# Patient Record
Sex: Male | Born: 1985 | Race: White | Hispanic: No | Marital: Single
Health system: Southern US, Community
[De-identification: ages and names within clinical notes are randomized; demographics above are authoritative.]

## PROBLEM LIST (undated history)

## (undated) DIAGNOSIS — R7303 Prediabetes: Secondary | ICD-10-CM

## (undated) DIAGNOSIS — J45909 Unspecified asthma, uncomplicated: Secondary | ICD-10-CM

## (undated) DIAGNOSIS — I10 Essential (primary) hypertension: Secondary | ICD-10-CM

## (undated) DIAGNOSIS — M549 Dorsalgia, unspecified: Secondary | ICD-10-CM

## (undated) DIAGNOSIS — E079 Disorder of thyroid, unspecified: Secondary | ICD-10-CM

---
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
## 2007-01-07 ENCOUNTER — Emergency Department (HOSPITAL_COMMUNITY): Admission: EM | Admit: 2007-01-07 | Discharge: 2007-01-07 | Payer: Self-pay | Admitting: Emergency Medicine

## 2007-01-09 ENCOUNTER — Emergency Department (HOSPITAL_COMMUNITY): Admission: EM | Admit: 2007-01-09 | Discharge: 2007-01-09 | Payer: Self-pay | Admitting: Emergency Medicine

## 2007-10-11 ENCOUNTER — Emergency Department (HOSPITAL_COMMUNITY): Admission: EM | Admit: 2007-10-11 | Discharge: 2007-10-11 | Payer: Self-pay | Admitting: Emergency Medicine

## 2010-07-01 NOTE — Consult Note (Signed)
NAMEAHMON, TOSI               ACCOUNT NO.:  [id number]   MEDICAL RECORD NO.:  [id number]          PATIENT TYPE:  EMS   LOCATION:  [facility]                         FACILITY:  [facility]   PHYSICIAN:  Noralyn Pick. Eden Emms, MD, FACCDATE OF BIRTH:  1985/03/22   DATE OF CONSULTATION:  DATE OF DISCHARGE:  04/21/2006                                 CONSULTATION   He is new to [facility].  He will be seen by Dr. Eden Emms.   PATIENT PROFILE:  An 25 year old Caucasian male with prior history of  cocaine, tobacco, and alcohol abuse, who presented to the ED with chest  pain in the setting of positive urine drug screen for cocaine.   PROBLEM LIST:  1. Chest pain.  2. Substance abuse.      a.     Cocaine x9 months.  He says he is out of rehab for the past       two weeks and has not used cocaine in one month, although urine       drug screen positive for cocaine.      b.     ETOH:  Drinks less than once a week.      c.     Tobacco:  Smokes one pack a day for the past year.   HISTORY OF PRESENT ILLNESS:  An 25 year old Caucasian male with prior  history of tobacco, alcohol, and cocaine, abuse recently discharged from  rehab approximately two weeks ago by his report.  He denies using  cocaine for approximately one month.  Initially the patient told me that  he was at a friend's house yesterday, March 7, when he developed 7/10  left chest pain like an ice pick stabbing him with each heart beat,  associated with mild shortness of breath.  He says his symptoms started  at 7:30 yesterday morning and that after about two and a half hours or  so he presented to the [facility] ED.  He goes on to tell me that he has  received some pain medication that has helped his chest pain some,  bringing it from a 7/10 down to a 6/10 over the past 24 hours.  In  reality, however, following review of the patient's ER records, he  presented to the ED at 9 o'clock this morning.  In questioning him about  this, he says  he guesses he was wrong and that his chest pain must have  started at 7:30 this morning, not yesterday morning.  Regardless, he  continues to complain of 6/10 chest pain, although he dozes off  intermittently throughout the interview and is currently sleeping now  and I have left the room.  His first set of cardiac enzymes were  negative and his EKG shows fairly significant LVH with some nonspecific  T wave abnormalities and minimal ST depression in the anterior leads,  with ST depression in lead III.   ALLERGIES:  NO KNOWN DRUG ALLERGIES.   HOME MEDICATIONS:  None, although the patient says he took some Vicodin  a couple of days ago.  It is not clear where  he got it from or why he  took it.   FAMILY HISTORY:  His mother is alive and well.  She is aged 7.  His  father is alive and well at age 68.  He has a brother who is aged 58, a  sister who is aged 49; both are alive and well.   SOCIAL HISTORY:  He lives in [place] with his mother.  He is  currently unemployed.  He is not married and does not have any children.  He smokes one pack of cigarettes a day for the past year.  He uses  alcohol less than once a week, and was using cocaine regularly over a  nine-month period but says he has not used any in the past month and  finished rehab about two weeks ago.   REVIEW OF SYSTEMS:  Positive for chest pain which there is a pleuritic  component worse with deep breathing.  He also has some shortness of  breath earlier this morning.  Otherwise, all systems reviewed are  negative.   PHYSICAL EXAMINATION:  VITAL SIGNS:  Temperature 98.1, heart rate 60,  respirations 16, blood pressure 133/58, pulse ox 100% on room air.  GENERAL:  Pleasant white male in no acute distress, awake and oriented  x3, although he dozes off during interview and exam.  NECK:  No bruits or JVD.  LUNGS:  Respirations are unlabored, clear to auscultation.  CARDIAC:  Regular S1, S2.  No S3, S4, murmurs.  ABDOMEN:   Round, soft, nontender, nondistended. Bowel sounds present.  LOWER EXTREMITIES:  Warm and dry, pink.  No clubbing, cyanosis or edema.  Dorsalis pedis, posterior tibial pulses 2+ and equal bilaterally.   Chest x-ray shows no active cardiopulmonary disease.  EKG shows sinus  rhythm with a rightward axis, LVH, early repolarization, and ST  depression in lead III.   LAB WORK:  Hemoglobin 16, hematocrit 46.3, WBC 8.2, platelets 197.  Sodium 139, potassium 3.9, chloride 107, CO2 22, BUN 10, creatinine 0.8,  glucose 89.  CK-MB less than 1.  Troponin less than 0.05.  Urine drug  screen positive for cocaine.  ETOH less than 5.   ASSESSMENT/PLAN:  1. Chest pain occurring in the setting of cocaine abuse.  EKG with      left ventricular hypertrophy and minimal ST elevation, likely early      repolarization along with ST depression in lead III.  Patient      continues to complain of 6/10 chest pain, although he was sleeping      when I entered the room, dozed off some during the interview, and      is sleeping again without any intervention to treat his chest pain.      There is a pleuritic component to his chest pain.  Given his      significant LVH on EKG, we will obtain a 2D echocardiogram here in      the ER to evaluate/rule out hypertrophic heart disease.  First set      of cardiac markers are negative and we plan to continue the cycle      for complete rule out.  Would not use beta blocker given      concomitant cocaine usage.  2. Substance abuse.  Reportedly just discharged from rehabilitation      about two weeks ago.  Social Work should likely be involved in this      patient's care as his urine drug screen is once again  positive for      cocaine.      Nicolasa Ducking, ANP      Noralyn Pick. Eden Emms, MD, [facility]  Electronically Signed    CB/MEDQ  D:  04/21/2006  T:  04/22/2006  Job:  [id number]

## 2010-11-22 LAB — CBC
MCHC: 34.8
MCV: 95.1
Platelets: 260
RDW: 12.2

## 2010-11-22 LAB — COMPREHENSIVE METABOLIC PANEL
AST: 16
CO2: 25
Calcium: 9.1
Creatinine, Ser: 1.04
GFR calc Af Amer: >60
GFR calc non Af Amer: >60
Total Protein: 6.9

## 2010-11-22 LAB — DIFFERENTIAL
Eosinophils Relative: 1
Lymphocytes Relative: 33
Lymphs Abs: 3.2

## 2010-11-22 LAB — URINALYSIS, ROUTINE W REFLEX MICROSCOPIC
Glucose, UA: NEGATIVE
Ketones, ur: 15 — AB
Leukocytes, UA: NEGATIVE
pH: 6

## 2010-11-22 LAB — URINE MICROSCOPIC-ADD ON

## 2010-11-30 LAB — POCT CARDIAC MARKERS
CKMB, poc: <1 — ABNORMAL LOW
Myoglobin, poc: 32.6
Operator id: 234501
Troponin i, poc: <0.05

## 2010-11-30 LAB — I-STAT 8, (EC8 V) (CONVERTED LAB)
BUN: 13
Glucose, Bld: 46 — ABNORMAL LOW
Potassium: 4
Sodium: 142

## 2010-11-30 LAB — CK TOTAL AND CKMB (NOT AT ARMC): Total CK: 120

## 2010-12-01 LAB — I-STAT 8, (EC8 V) (CONVERTED LAB)
Acid-base deficit: 2
Chloride: 108
Glucose, Bld: 114 — ABNORMAL HIGH
Hemoglobin: 16
Potassium: 3.7
pH, Ven: 7.353 — ABNORMAL HIGH

## 2010-12-01 LAB — RAPID URINE DRUG SCREEN, HOSP PERFORMED
Barbiturates: NOT DETECTED
Benzodiazepines: NOT DETECTED
Cocaine: POSITIVE — AB

## 2010-12-01 LAB — ETHANOL: Alcohol, Ethyl (B): 42 — ABNORMAL HIGH

## 2010-12-01 LAB — POCT I-STAT CREATININE: Operator id: 270651

## 2017-10-02 ENCOUNTER — Encounter (HOSPITAL_COMMUNITY): Payer: Self-pay | Admitting: Radiology

## 2017-10-02 ENCOUNTER — Emergency Department (HOSPITAL_COMMUNITY): Payer: Medicaid Other

## 2017-10-02 ENCOUNTER — Emergency Department (HOSPITAL_COMMUNITY)
Admission: EM | Admit: 2017-10-02 | Discharge: 2017-10-03 | Disposition: A | Discharge status: Home / Self Care | Payer: Medicaid Other | Attending: Emergency Medicine | Admitting: Emergency Medicine

## 2017-10-02 DIAGNOSIS — Y92524 Gas station as the place of occurrence of the external cause: Secondary | ICD-10-CM | POA: Insufficient documentation

## 2017-10-02 DIAGNOSIS — Y990 Civilian activity done for income or pay: Secondary | ICD-10-CM | POA: Insufficient documentation

## 2017-10-02 DIAGNOSIS — Y939 Activity, unspecified: Secondary | ICD-10-CM | POA: Insufficient documentation

## 2017-10-02 DIAGNOSIS — M545 Low back pain: Secondary | ICD-10-CM

## 2017-10-02 DIAGNOSIS — R202 Paresthesia of skin: Secondary | ICD-10-CM

## 2017-10-02 DIAGNOSIS — S161XXA Strain of muscle, fascia and tendon at neck level, initial encounter: Secondary | ICD-10-CM

## 2017-10-02 MED ORDER — FENTANYL CITRATE (PF) 100 MCG/2ML IJ SOLN
100.0000 ug | Freq: Once | INTRAMUSCULAR | Status: AC
  Administered 2017-10-02: 100 ug via INTRAVENOUS

## 2017-10-02 MED ORDER — NAPROXEN 500 MG PO TABS: 500.0000 mg | ORAL_TABLET | Freq: Two times a day (BID) | ORAL | 0 refills | Status: DC

## 2017-10-02 MED ORDER — FENTANYL CITRATE (PF) 100 MCG/2ML IJ SOLN
100.0000 ug | Freq: Once | INTRAMUSCULAR | Status: DC
  Filled 2017-10-02: qty 2

## 2017-10-02 MED ORDER — HYDROCODONE-ACETAMINOPHEN 5-325 MG PO TABS
2.0000 | ORAL_TABLET | Freq: Once | ORAL | Status: AC
  Administered 2017-10-02: 2 via ORAL
  Filled 2017-10-02: qty 2

## 2017-10-02 MED ORDER — METHOCARBAMOL 500 MG PO TABS: 500.0000 mg | ORAL_TABLET | Freq: Two times a day (BID) | ORAL | 0 refills | Status: DC

## 2017-10-02 MED ORDER — KETOROLAC TROMETHAMINE 60 MG/2ML IM SOLN
60.0000 mg | Freq: Once | INTRAMUSCULAR | Status: AC
  Administered 2017-10-02: 60 mg via INTRAMUSCULAR
  Filled 2017-10-02: qty 2
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
## 2017-10-02 NOTE — ED Notes (Signed)
Patient transported to CT 

## 2017-10-02 NOTE — ED Notes (Signed)
PA at bedside.
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
## 2017-10-02 NOTE — ED Notes (Signed)
Patient transported to MRI 

## 2017-11-18 ENCOUNTER — Encounter (HOSPITAL_COMMUNITY): Payer: Self-pay

## 2017-11-18 ENCOUNTER — Emergency Department (HOSPITAL_COMMUNITY)
Admission: EM | Admit: 2017-11-18 | Discharge: 2017-11-18 | Disposition: A | Discharge status: Home / Self Care | Payer: Medicaid Other | Attending: Emergency Medicine | Admitting: Emergency Medicine

## 2017-11-18 ENCOUNTER — Other Ambulatory Visit: Payer: Self-pay

## 2017-11-18 ENCOUNTER — Emergency Department (HOSPITAL_COMMUNITY): Payer: Medicaid Other

## 2017-11-18 DIAGNOSIS — M25561 Pain in right knee: Secondary | ICD-10-CM | POA: Insufficient documentation

## 2017-11-18 DIAGNOSIS — Z79899 Other long term (current) drug therapy: Secondary | ICD-10-CM | POA: Diagnosis not present

## 2017-11-18 NOTE — Discharge Instructions (Signed)
1. Medications: Continue taking your home medicines.  The medicines you are taking for your back pain can also help with your knee pain.  You can also take [phone] mg of Tylenol every 6 hours as needed for pain. Do not exceed 4000 mg of Tylenol daily.   2. Treatment: rest, ice, elevate and use brace, drink plenty of fluids, gentle stretching 3. Follow Up: Please followup with orthopedics as directed or your PCP in 1 week if no improvement for discussion of your diagnoses and further evaluation after today's visit; if you do not have a primary care doctor use the resource guide provided to find one; Please return to the ER for worsening symptoms or other concerns such as worsening swelling, redness of the skin, fevers, loss of pulses, or loss of feeling
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
## 2018-03-31 ENCOUNTER — Emergency Department (HOSPITAL_COMMUNITY): Payer: Medicaid Other

## 2018-03-31 ENCOUNTER — Other Ambulatory Visit: Payer: Self-pay

## 2018-03-31 ENCOUNTER — Emergency Department (HOSPITAL_COMMUNITY)
Admission: EM | Admit: 2018-03-31 | Discharge: 2018-03-31 | Disposition: A | Discharge status: Home / Self Care | Payer: Medicaid Other | Attending: Emergency Medicine | Admitting: Emergency Medicine

## 2018-03-31 ENCOUNTER — Encounter (HOSPITAL_COMMUNITY): Payer: Self-pay | Admitting: Emergency Medicine

## 2018-03-31 DIAGNOSIS — S8392XA Sprain of unspecified site of left knee, initial encounter: Secondary | ICD-10-CM | POA: Diagnosis not present

## 2018-03-31 DIAGNOSIS — Y999 Unspecified external cause status: Secondary | ICD-10-CM | POA: Diagnosis not present

## 2018-03-31 DIAGNOSIS — Z79899 Other long term (current) drug therapy: Secondary | ICD-10-CM | POA: Insufficient documentation

## 2018-03-31 DIAGNOSIS — Y939 Activity, unspecified: Secondary | ICD-10-CM | POA: Diagnosis not present

## 2018-03-31 DIAGNOSIS — X58XXXA Exposure to other specified factors, initial encounter: Secondary | ICD-10-CM | POA: Diagnosis not present

## 2018-03-31 DIAGNOSIS — S8992XA Unspecified injury of left lower leg, initial encounter: Secondary | ICD-10-CM | POA: Diagnosis present

## 2018-03-31 DIAGNOSIS — Y929 Unspecified place or not applicable: Secondary | ICD-10-CM | POA: Diagnosis not present

## 2018-03-31 MED ORDER — IBUPROFEN 800 MG PO TABS: 800.0000 mg | ORAL_TABLET | Freq: Three times a day (TID) | ORAL | 0 refills | Status: DC
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
## 2018-03-31 NOTE — Discharge Instructions (Signed)
Return if any problems.

## 2018-10-25 ENCOUNTER — Inpatient Hospital Stay (HOSPITAL_COMMUNITY)
Admission: EM | Admit: 2018-10-25 | Discharge: 2018-10-25 | DRG: 917 | Disposition: A | Discharge status: Home / Self Care | Payer: Medicaid Other | Attending: Pulmonary Disease | Admitting: Pulmonary Disease

## 2018-10-25 ENCOUNTER — Inpatient Hospital Stay (HOSPITAL_COMMUNITY): Payer: Medicaid Other

## 2018-10-25 ENCOUNTER — Encounter (HOSPITAL_COMMUNITY): Payer: Self-pay | Admitting: Emergency Medicine

## 2018-10-25 ENCOUNTER — Emergency Department (HOSPITAL_COMMUNITY): Payer: Medicaid Other

## 2018-10-25 ENCOUNTER — Other Ambulatory Visit: Payer: Self-pay

## 2018-10-25 DIAGNOSIS — R402112 Coma scale, eyes open, never, at arrival to emergency department: Secondary | ICD-10-CM | POA: Diagnosis present

## 2018-10-25 DIAGNOSIS — G92 Toxic encephalopathy: Secondary | ICD-10-CM | POA: Diagnosis present

## 2018-10-25 DIAGNOSIS — I10 Essential (primary) hypertension: Secondary | ICD-10-CM | POA: Diagnosis present

## 2018-10-25 DIAGNOSIS — Y907 Blood alcohol level of 200-239 mg/100 ml: Secondary | ICD-10-CM | POA: Diagnosis present

## 2018-10-25 DIAGNOSIS — G8929 Other chronic pain: Secondary | ICD-10-CM | POA: Diagnosis present

## 2018-10-25 DIAGNOSIS — T404X1A Poisoning by other synthetic narcotics, accidental (unintentional), initial encounter: Secondary | ICD-10-CM | POA: Diagnosis present

## 2018-10-25 DIAGNOSIS — U071 COVID-19: Secondary | ICD-10-CM | POA: Diagnosis present

## 2018-10-25 DIAGNOSIS — R402432 Glasgow coma scale score 3-8, at arrival to emergency department: Secondary | ICD-10-CM | POA: Diagnosis not present

## 2018-10-25 DIAGNOSIS — H9190 Unspecified hearing loss, unspecified ear: Secondary | ICD-10-CM | POA: Diagnosis present

## 2018-10-25 DIAGNOSIS — T428X1A Poisoning by antiparkinsonism drugs and other central muscle-tone depressants, accidental (unintentional), initial encounter: Secondary | ICD-10-CM | POA: Diagnosis present

## 2018-10-25 DIAGNOSIS — J9690 Respiratory failure, unspecified, unspecified whether with hypoxia or hypercapnia: Secondary | ICD-10-CM | POA: Diagnosis present

## 2018-10-25 DIAGNOSIS — F1010 Alcohol abuse, uncomplicated: Secondary | ICD-10-CM

## 2018-10-25 DIAGNOSIS — G9340 Encephalopathy, unspecified: Secondary | ICD-10-CM | POA: Diagnosis not present

## 2018-10-25 DIAGNOSIS — R402312 Coma scale, best motor response, none, at arrival to emergency department: Secondary | ICD-10-CM | POA: Diagnosis present

## 2018-10-25 DIAGNOSIS — R55 Syncope and collapse: Secondary | ICD-10-CM | POA: Diagnosis not present

## 2018-10-25 DIAGNOSIS — M549 Dorsalgia, unspecified: Secondary | ICD-10-CM | POA: Diagnosis present

## 2018-10-25 DIAGNOSIS — I252 Old myocardial infarction: Secondary | ICD-10-CM

## 2018-10-25 DIAGNOSIS — I2510 Atherosclerotic heart disease of native coronary artery without angina pectoris: Secondary | ICD-10-CM | POA: Diagnosis present

## 2018-10-25 DIAGNOSIS — Z978 Presence of other specified devices: Secondary | ICD-10-CM | POA: Insufficient documentation

## 2018-10-25 DIAGNOSIS — R40243 Glasgow coma scale score 3-8, unspecified time: Secondary | ICD-10-CM | POA: Insufficient documentation

## 2018-10-25 DIAGNOSIS — J9601 Acute respiratory failure with hypoxia: Secondary | ICD-10-CM

## 2018-10-25 DIAGNOSIS — R402212 Coma scale, best verbal response, none, at arrival to emergency department: Secondary | ICD-10-CM | POA: Diagnosis present

## 2018-10-25 DIAGNOSIS — R4182 Altered mental status, unspecified: Secondary | ICD-10-CM | POA: Diagnosis present

## 2018-10-25 LAB — CBC WITH DIFFERENTIAL/PLATELET
Abs Immature Granulocytes: 0.05 10*3/uL (ref 0.00–0.07)
Basophils Absolute: 0.1 10*3/uL (ref 0.0–0.1)
Basophils Relative: 1 %
Eosinophils Absolute: 0.1 10*3/uL (ref 0.0–0.5)
Eosinophils Relative: 2 %
HCT: 46.1 % (ref 39.0–52.0)
Hemoglobin: 16.1 g/dL (ref 13.0–17.0)
Immature Granulocytes: 1 %
Lymphocytes Relative: 53 %
Lymphs Abs: 5.1 10*3/uL — ABNORMAL HIGH (ref 0.7–4.0)
MCH: 33.8 pg (ref 26.0–34.0)
MCHC: 34.9 g/dL (ref 30.0–36.0)
MCV: 96.6 fL (ref 80.0–100.0)
Monocytes Absolute: 0.8 10*3/uL (ref 0.1–1.0)
Monocytes Relative: 9 %
Neutro Abs: 3.2 10*3/uL (ref 1.7–7.7)
Neutrophils Relative %: 34 %
Platelets: 172 10*3/uL (ref 150–400)
RBC: 4.77 MIL/uL (ref 4.22–5.81)
RDW: 11.7 % (ref 11.5–15.5)
WBC: 9.3 10*3/uL (ref 4.0–10.5)
nRBC: 0 % (ref 0.0–0.2)

## 2018-10-25 LAB — CREATININE, SERUM
Creatinine, Ser: 0.85 mg/dL (ref 0.61–1.24)
GFR calc Af Amer: >60 mL/min (ref >60)
GFR calc non Af Amer: >60 mL/min (ref >60)

## 2018-10-25 LAB — TRIGLYCERIDES: Triglycerides: 338 mg/dL — ABNORMAL HIGH (ref <150)

## 2018-10-25 LAB — URINALYSIS, COMPLETE (UACMP) WITH MICROSCOPIC
Bacteria, UA: NONE SEEN
Bilirubin Urine: NEGATIVE
Glucose, UA: NEGATIVE mg/dL
Hgb urine dipstick: NEGATIVE
Ketones, ur: NEGATIVE mg/dL
Leukocytes,Ua: NEGATIVE
Nitrite: NEGATIVE
Protein, ur: NEGATIVE mg/dL
Specific Gravity, Urine: 1.005 (ref 1.005–1.030)
pH: 6 (ref 5.0–8.0)

## 2018-10-25 LAB — LACTATE DEHYDROGENASE: LDH: 185 U/L (ref 98–192)

## 2018-10-25 LAB — COMPREHENSIVE METABOLIC PANEL
ALT: 83 U/L — ABNORMAL HIGH (ref 0–44)
AST: 44 U/L — ABNORMAL HIGH (ref 15–41)
Albumin: 4.3 g/dL (ref 3.5–5.0)
Alkaline Phosphatase: 72 U/L (ref 38–126)
Anion gap: 12 (ref 5–15)
BUN: 16 mg/dL (ref 6–20)
CO2: 22 mmol/L (ref 22–32)
Calcium: 9 mg/dL (ref 8.9–10.3)
Chloride: 106 mmol/L (ref 98–111)
Creatinine, Ser: 1.05 mg/dL (ref 0.61–1.24)
GFR calc Af Amer: >60 mL/min (ref >60)
GFR calc non Af Amer: >60 mL/min (ref >60)
Glucose, Bld: 119 mg/dL — ABNORMAL HIGH (ref 70–99)
Potassium: 3.5 mmol/L (ref 3.5–5.1)
Sodium: 140 mmol/L (ref 135–145)
Total Bilirubin: 0.5 mg/dL (ref 0.3–1.2)
Total Protein: 7.1 g/dL (ref 6.5–8.1)

## 2018-10-25 LAB — TROPONIN I (HIGH SENSITIVITY)
Troponin I (High Sensitivity): 5 ng/L (ref <18)
Troponin I (High Sensitivity): 7 ng/L (ref <18)

## 2018-10-25 LAB — CBC
HCT: 41.7 % (ref 39.0–52.0)
Hemoglobin: 14.7 g/dL (ref 13.0–17.0)
MCH: 33.5 pg (ref 26.0–34.0)
MCHC: 35.3 g/dL (ref 30.0–36.0)
MCV: 95 fL (ref 80.0–100.0)
Platelets: 159 10*3/uL (ref 150–400)
RBC: 4.39 MIL/uL (ref 4.22–5.81)
RDW: 11.7 % (ref 11.5–15.5)
WBC: 8.2 10*3/uL (ref 4.0–10.5)
nRBC: 0 % (ref 0.0–0.2)

## 2018-10-25 LAB — POCT I-STAT 7, (LYTES, BLD GAS, ICA,H+H)
Acid-base deficit: 1 mmol/L (ref 0.0–2.0)
Bicarbonate: 24.9 mmol/L (ref 20.0–28.0)
Calcium, Ion: 1.19 mmol/L (ref 1.15–1.40)
HCT: 44 % (ref 39.0–52.0)
Hemoglobin: 15 g/dL (ref 13.0–17.0)
O2 Saturation: 100 %
Patient temperature: 98.4
Potassium: 3.7 mmol/L (ref 3.5–5.1)
Sodium: 141 mmol/L (ref 135–145)
TCO2: 26 mmol/L (ref 22–32)
pCO2 arterial: 46.3 mmHg (ref 32.0–48.0)
pH, Arterial: 7.338 — ABNORMAL LOW (ref 7.350–7.450)
pO2, Arterial: 571 mmHg — ABNORMAL HIGH (ref 83.0–108.0)

## 2018-10-25 LAB — ETHANOL: Alcohol, Ethyl (B): 224 mg/dL — ABNORMAL HIGH (ref <10)

## 2018-10-25 LAB — GLUCOSE, CAPILLARY
Glucose-Capillary: 105 mg/dL — ABNORMAL HIGH (ref 70–99)
Glucose-Capillary: 125 mg/dL — ABNORMAL HIGH (ref 70–99)

## 2018-10-25 LAB — RAPID URINE DRUG SCREEN, HOSP PERFORMED
Amphetamines: NOT DETECTED
Barbiturates: NOT DETECTED
Benzodiazepines: POSITIVE — AB
Cocaine: NOT DETECTED
Opiates: NOT DETECTED
Tetrahydrocannabinol: NOT DETECTED

## 2018-10-25 LAB — ECHOCARDIOGRAM COMPLETE
Height: 70 in
Weight: 3781.33 oz

## 2018-10-25 LAB — SARS CORONAVIRUS 2 BY RT PCR (HOSPITAL ORDER, PERFORMED IN ~~LOC~~ HOSPITAL LAB): SARS Coronavirus 2: POSITIVE — AB

## 2018-10-25 LAB — PROTIME-INR
INR: 1 (ref 0.8–1.2)
Prothrombin Time: 12.9 seconds (ref 11.4–15.2)

## 2018-10-25 LAB — LACTIC ACID, PLASMA
Lactic Acid, Venous: 2.5 mmol/L (ref 0.5–1.9)
Lactic Acid, Venous: 2.5 mmol/L (ref 0.5–1.9)

## 2018-10-25 LAB — SARS CORONAVIRUS 2 (TAT 6-24 HRS): SARS Coronavirus 2: NEGATIVE

## 2018-10-25 LAB — FERRITIN: Ferritin: 450 ng/mL — ABNORMAL HIGH (ref 24–336)

## 2018-10-25 LAB — APTT: aPTT: 31 seconds (ref 24–36)

## 2018-10-25 LAB — HIV ANTIBODY (ROUTINE TESTING W REFLEX): HIV Screen 4th Generation wRfx: NONREACTIVE

## 2018-10-25 LAB — MRSA PCR SCREENING: MRSA by PCR: NEGATIVE

## 2018-10-25 LAB — D-DIMER, QUANTITATIVE: D-Dimer, Quant: <0.27 ug/mL-FEU (ref 0.00–0.50)

## 2018-10-25 MED ORDER — VITAMIN B-1 100 MG PO TABS: 100.0000 mg | ORAL_TABLET | Freq: Every day | ORAL | Status: DC

## 2018-10-25 MED ORDER — MIDAZOLAM HCL 2 MG/2ML IJ SOLN
2.0000 mg | Freq: Once | INTRAMUSCULAR | Status: AC
  Administered 2018-10-25: 2 mg via INTRAVENOUS

## 2018-10-25 MED ORDER — PROPOFOL 1000 MG/100ML IV EMUL
INTRAVENOUS | Status: AC
  Administered 2018-10-25: 01:00:00 via INTRAVENOUS
  Filled 2018-10-25: qty 100

## 2018-10-25 MED ORDER — ETOMIDATE 2 MG/ML IV SOLN
INTRAVENOUS | Status: AC | PRN
  Administered 2018-10-25: 20 mg via INTRAVENOUS

## 2018-10-25 MED ORDER — AMLODIPINE BESYLATE 5 MG PO TABS
5.0000 mg | ORAL_TABLET | Freq: Every day | ORAL | Status: DC
  Administered 2018-10-25: 5 mg via ORAL
  Filled 2018-10-25 (×2): qty 1

## 2018-10-25 MED ORDER — SODIUM CHLORIDE 0.9 % IV SOLN
INTRAVENOUS | Status: DC | PRN
  Administered 2018-10-25: 1000 mL via INTRAVENOUS

## 2018-10-25 MED ORDER — FAMOTIDINE IN NACL 20-0.9 MG/50ML-% IV SOLN
20.0000 mg | Freq: Two times a day (BID) | INTRAVENOUS | Status: DC
  Administered 2018-10-25: 11:00:00 20 mg via INTRAVENOUS
  Filled 2018-10-25: qty 50

## 2018-10-25 MED ORDER — GABAPENTIN 250 MG/5ML PO SOLN
100.0000 mg | Freq: Three times a day (TID) | ORAL | Status: DC
  Administered 2018-10-25: 100 mg
  Filled 2018-10-25 (×3): qty 2

## 2018-10-25 MED ORDER — AMLODIPINE BESYLATE 5 MG PO TABS: 5.0000 mg | ORAL_TABLET | Freq: Every day | ORAL | 3 refills | Status: DC

## 2018-10-25 MED ORDER — MIDAZOLAM HCL 2 MG/2ML IJ SOLN
INTRAMUSCULAR | Status: AC
  Filled 2018-10-25: qty 2

## 2018-10-25 MED ORDER — FENTANYL CITRATE (PF) 100 MCG/2ML IJ SOLN
100.0000 ug | INTRAMUSCULAR | Status: DC | PRN
  Administered 2018-10-25: 100 ug via INTRAVENOUS
  Filled 2018-10-25: qty 2

## 2018-10-25 MED ORDER — FOLIC ACID 5 MG/ML IJ SOLN
1.0000 mg | Freq: Once | INTRAMUSCULAR | Status: AC
  Administered 2018-10-25: 05:00:00 1 mg via INTRAVENOUS
  Filled 2018-10-25: qty 0.2

## 2018-10-25 MED ORDER — NALOXONE HCL 2 MG/2ML IJ SOSY
PREFILLED_SYRINGE | INTRAMUSCULAR | Status: AC
  Filled 2018-10-25: qty 2

## 2018-10-25 MED ORDER — PROPOFOL 1000 MG/100ML IV EMUL
0.0000 ug/kg/min | INTRAVENOUS | Status: DC
  Administered 2018-10-25: 01:00:00 via INTRAVENOUS
  Administered 2018-10-25: 07:00:00 20 ug/kg/min via INTRAVENOUS
  Filled 2018-10-25: qty 100

## 2018-10-25 MED ORDER — FENTANYL CITRATE (PF) 100 MCG/2ML IJ SOLN
INTRAMUSCULAR | Status: AC
  Filled 2018-10-25: qty 2

## 2018-10-25 MED ORDER — MIDAZOLAM HCL 2 MG/2ML IJ SOLN: 2.0000 mg | INTRAMUSCULAR | Status: DC | PRN

## 2018-10-25 MED ORDER — CHLORHEXIDINE GLUCONATE CLOTH 2 % EX PADS
6.0000 | MEDICATED_PAD | Freq: Every day | CUTANEOUS | Status: DC
  Administered 2018-10-25: 6 via TOPICAL

## 2018-10-25 MED ORDER — FENTANYL CITRATE (PF) 100 MCG/2ML IJ SOLN
100.0000 ug | Freq: Once | INTRAMUSCULAR | Status: AC
  Administered 2018-10-25: 01:00:00 100 ug via INTRAVENOUS
  Filled 2018-10-25: qty 2

## 2018-10-25 MED ORDER — PERFLUTREN LIPID MICROSPHERE
1.0000 mL | INTRAVENOUS | Status: AC | PRN
  Administered 2018-10-25: 11:00:00 3.5 mL via INTRAVENOUS
  Filled 2018-10-25: qty 10

## 2018-10-25 MED ORDER — FENTANYL CITRATE (PF) 100 MCG/2ML IJ SOLN
100.0000 ug | Freq: Once | INTRAMUSCULAR | Status: AC
  Administered 2018-10-25: 02:00:00 100 ug via INTRAVENOUS

## 2018-10-25 MED ORDER — THIAMINE HCL 100 MG/ML IJ SOLN
100.0000 mg | Freq: Every day | INTRAMUSCULAR | Status: DC
  Administered 2018-10-25: 11:00:00 100 mg via INTRAVENOUS
  Filled 2018-10-25 (×2): qty 2

## 2018-10-25 MED ORDER — NALOXONE HCL 2 MG/2ML IJ SOSY
PREFILLED_SYRINGE | INTRAMUSCULAR | Status: AC | PRN
  Administered 2018-10-25: 1 mg via INTRAVENOUS

## 2018-10-25 MED ORDER — ZINC SULFATE 220 (50 ZN) MG PO CAPS
220.0000 mg | ORAL_CAPSULE | Freq: Every day | ORAL | Status: DC
  Administered 2018-10-25: 05:00:00 220 mg via ORAL
  Filled 2018-10-25 (×2): qty 1

## 2018-10-25 MED ORDER — FENTANYL CITRATE (PF) 100 MCG/2ML IJ SOLN: 100.0000 ug | INTRAMUSCULAR | Status: DC | PRN

## 2018-10-25 MED ORDER — MIDAZOLAM HCL 2 MG/2ML IJ SOLN
2.0000 mg | INTRAMUSCULAR | Status: DC | PRN
  Filled 2018-10-25: qty 2

## 2018-10-25 MED ORDER — SODIUM CHLORIDE 0.9 % IV SOLN: 1.0000 mg | Freq: Once | INTRAVENOUS | Status: DC

## 2018-10-25 MED ORDER — ORAL CARE MOUTH RINSE
15.0000 mL | OROMUCOSAL | Status: DC
  Administered 2018-10-25: 06:00:00 15 mL via OROMUCOSAL

## 2018-10-25 MED ORDER — CHLORHEXIDINE GLUCONATE 0.12% ORAL RINSE (MEDLINE KIT)
15.0000 mL | Freq: Two times a day (BID) | OROMUCOSAL | Status: DC
  Administered 2018-10-25: 08:00:00 15 mL via OROMUCOSAL

## 2018-10-25 MED ORDER — SODIUM CHLORIDE 0.9 % IV BOLUS (SEPSIS)
1000.0000 mL | Freq: Once | INTRAVENOUS | Status: AC
  Administered 2018-10-25: 03:00:00 1000 mL via INTRAVENOUS

## 2018-10-25 MED ORDER — PROPOFOL 1000 MG/100ML IV EMUL
INTRAVENOUS | Status: AC
  Filled 2018-10-25: qty 100

## 2018-10-25 MED ORDER — MIDAZOLAM HCL 2 MG/2ML IJ SOLN
2.0000 mg | Freq: Once | INTRAMUSCULAR | Status: AC
  Administered 2018-10-25: 2 mg via INTRAVENOUS
  Filled 2018-10-25: qty 2

## 2018-10-25 MED ORDER — HEPARIN SODIUM (PORCINE) 5000 UNIT/ML IJ SOLN
5000.0000 [IU] | Freq: Three times a day (TID) | INTRAMUSCULAR | Status: DC
  Administered 2018-10-25: 5000 [IU] via SUBCUTANEOUS
  Filled 2018-10-25: qty 1

## 2018-10-25 MED ORDER — SUCCINYLCHOLINE CHLORIDE 20 MG/ML IJ SOLN
INTRAMUSCULAR | Status: AC | PRN
  Administered 2018-10-25: 200 mg via INTRAVENOUS

## 2018-10-25 NOTE — Progress Notes (Signed)
Patient transported to CT and back without any complications.  

## 2018-10-25 NOTE — Progress Notes (Signed)
Discharged home with girlfriend - all questions answered. Discharge paperwork discussed and taken with patient.

## 2018-10-25 NOTE — Progress Notes (Signed)
Echocardiogram 2D Echocardiogram has been performed.  Oneal Deputy Breslin Hemann 10/25/2018, 11:39 AM
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
## 2018-10-25 NOTE — ED Notes (Signed)
Date and time results received: 10/25/18  (use smartphrase ".now" to insert current time)  Test: lac ti Critical Value: 2.5  Name of Provider Notified: Christy Gentles
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
## 2018-10-25 NOTE — Progress Notes (Signed)
Pt transported from ED TRAA to 0P23 with no complications.
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
## 2018-10-25 NOTE — Progress Notes (Signed)
Per patient request, mother and significant other called and updated on patient status and plan of care.

## 2018-10-25 NOTE — ED Triage Notes (Signed)
Pt brought to ED by [facility] from home after getting unresponsive at home. Per wife pt last took some Tramadol for pain with ETOH. On the way to ED pt started having some seizure activity and 5 mg Versed IV pta to ED. BOP 145/102, R 24, HR 120, SPO2 100 RA. CBG 84.

## 2018-10-25 NOTE — Procedures (Signed)
Extubation Procedure Note  Patient Details:   Name: Larry Rojas DOB: [birth year] MRN: [id number]   Airway Documentation:    Vent end date: 10/25/18 Vent end time: 0936   Evaluation  O2 sats: stable throughout Complications: No apparent complications Patient did tolerate procedure well. Bilateral Breath Sounds: Clear, Diminished   Yes   Pt extubated to 2L N/C.  No stridor noted.  RN @ bedside.  Donnetta Hail 10/25/2018, 9:47 AM

## 2018-10-28 ENCOUNTER — Encounter (HOSPITAL_COMMUNITY): Payer: Self-pay | Admitting: Emergency Medicine

## 2018-10-31 ENCOUNTER — Emergency Department (HOSPITAL_COMMUNITY): Payer: Medicaid Other

## 2018-10-31 ENCOUNTER — Encounter (HOSPITAL_COMMUNITY): Payer: Self-pay | Admitting: *Deleted

## 2018-10-31 ENCOUNTER — Emergency Department (HOSPITAL_COMMUNITY)
Admission: EM | Admit: 2018-10-31 | Discharge: 2018-10-31 | Disposition: A | Discharge status: Home / Self Care | Payer: Medicaid Other | Attending: Emergency Medicine | Admitting: Emergency Medicine

## 2018-10-31 ENCOUNTER — Other Ambulatory Visit: Payer: Self-pay

## 2018-10-31 DIAGNOSIS — Z79899 Other long term (current) drug therapy: Secondary | ICD-10-CM | POA: Diagnosis not present

## 2018-10-31 DIAGNOSIS — R079 Chest pain, unspecified: Secondary | ICD-10-CM | POA: Insufficient documentation

## 2018-10-31 LAB — CBC WITH DIFFERENTIAL/PLATELET
Abs Immature Granulocytes: 0.05 10*3/uL (ref 0.00–0.07)
Basophils Absolute: 0.1 10*3/uL (ref 0.0–0.1)
Basophils Relative: 1 %
Eosinophils Absolute: 0.2 10*3/uL (ref 0.0–0.5)
Eosinophils Relative: 2 %
HCT: 46 % (ref 39.0–52.0)
Hemoglobin: 16 g/dL (ref 13.0–17.0)
Immature Granulocytes: 1 %
Lymphocytes Relative: 46 %
Lymphs Abs: 4.4 10*3/uL — ABNORMAL HIGH (ref 0.7–4.0)
MCH: 33.6 pg (ref 26.0–34.0)
MCHC: 34.8 g/dL (ref 30.0–36.0)
MCV: 96.6 fL (ref 80.0–100.0)
Monocytes Absolute: 0.8 10*3/uL (ref 0.1–1.0)
Monocytes Relative: 8 %
Neutro Abs: 3.9 10*3/uL (ref 1.7–7.7)
Neutrophils Relative %: 42 %
Platelets: 192 10*3/uL (ref 150–400)
RBC: 4.76 MIL/uL (ref 4.22–5.81)
RDW: 11.4 % — ABNORMAL LOW (ref 11.5–15.5)
WBC: 9.4 10*3/uL (ref 4.0–10.5)
nRBC: 0 % (ref 0.0–0.2)

## 2018-10-31 LAB — BASIC METABOLIC PANEL
Anion gap: 16 — ABNORMAL HIGH (ref 5–15)
BUN: 18 mg/dL (ref 6–20)
CO2: 18 mmol/L — ABNORMAL LOW (ref 22–32)
Calcium: 8.8 mg/dL — ABNORMAL LOW (ref 8.9–10.3)
Chloride: 104 mmol/L (ref 98–111)
Creatinine, Ser: 0.83 mg/dL (ref 0.61–1.24)
GFR calc Af Amer: >60 mL/min (ref >60)
GFR calc non Af Amer: >60 mL/min (ref >60)
Glucose, Bld: 113 mg/dL — ABNORMAL HIGH (ref 70–99)
Potassium: 3.4 mmol/L — ABNORMAL LOW (ref 3.5–5.1)
Sodium: 138 mmol/L (ref 135–145)

## 2018-10-31 LAB — ETHANOL: Alcohol, Ethyl (B): 115 mg/dL — ABNORMAL HIGH (ref <10)

## 2018-10-31 LAB — TROPONIN I (HIGH SENSITIVITY): Troponin I (High Sensitivity): 4 ng/L (ref <18)

## 2018-10-31 NOTE — ED Notes (Signed)
Pt still sleeping...

## 2018-10-31 NOTE — Discharge Instructions (Signed)
Cardiac tests today were normal. Please follow-up with your primary care doctor. Return here for any new/acute changes.
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
## 2018-10-31 NOTE — ED Notes (Signed)
Sleeping still 
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
## 2018-10-31 NOTE — ED Notes (Signed)
Pt alert oriented skin warm and dry  No distress.   Iv per ems  Aspirin 324,g nitro sl  No effect except on his bp  Ems gave  114ml nss

## 2018-10-31 NOTE — ED Notes (Signed)
Pt sleeping soundly snoring  Stayed asleep even when I stuck him for lab work

## 2018-11-04 LAB — DRUG SCREEN 10 W/CONF, SERUM
Amphetamines, IA: NEGATIVE ng/mL
Barbiturates, IA: NEGATIVE ug/mL
Benzodiazepines, IA: POSITIVE ng/mL — AB
Cocaine & Metabolite, IA: NEGATIVE ng/mL
Methadone, IA: NEGATIVE ng/mL
Opiates, IA: NEGATIVE ng/mL
Oxycodones, IA: NEGATIVE ng/mL
Phencyclidine, IA: NEGATIVE ng/mL
Propoxyphene, IA: NEGATIVE ng/mL
THC(Marijuana) Metabolite, IA: NEGATIVE ng/mL

## 2019-05-20 ENCOUNTER — Ambulatory Visit (HOSPITAL_COMMUNITY): Payer: Medicaid Other | Admitting: Licensed Clinical Social Worker

## 2020-01-02 ENCOUNTER — Emergency Department (HOSPITAL_COMMUNITY)
Admission: EM | Admit: 2020-01-02 | Discharge: 2020-01-02 | Disposition: A | Discharge status: Home / Self Care | Payer: Medicaid Other | Attending: Emergency Medicine | Admitting: Emergency Medicine

## 2020-01-02 ENCOUNTER — Emergency Department (HOSPITAL_COMMUNITY): Payer: Medicaid Other

## 2020-01-02 ENCOUNTER — Other Ambulatory Visit: Payer: Self-pay

## 2020-01-02 ENCOUNTER — Encounter (HOSPITAL_COMMUNITY): Payer: Self-pay

## 2020-01-02 DIAGNOSIS — R55 Syncope and collapse: Secondary | ICD-10-CM | POA: Diagnosis not present

## 2020-01-02 DIAGNOSIS — U071 COVID-19: Secondary | ICD-10-CM

## 2020-01-02 DIAGNOSIS — F10920 Alcohol use, unspecified with intoxication, uncomplicated: Secondary | ICD-10-CM

## 2020-01-02 DIAGNOSIS — R0602 Shortness of breath: Secondary | ICD-10-CM | POA: Diagnosis not present

## 2020-01-02 DIAGNOSIS — R059 Cough, unspecified: Secondary | ICD-10-CM | POA: Diagnosis present

## 2020-01-02 DIAGNOSIS — Y906 Blood alcohol level of 120-199 mg/100 ml: Secondary | ICD-10-CM | POA: Diagnosis not present

## 2020-01-02 DIAGNOSIS — F10129 Alcohol abuse with intoxication, unspecified: Secondary | ICD-10-CM | POA: Insufficient documentation

## 2020-01-02 LAB — CBC WITH DIFFERENTIAL/PLATELET
Abs Immature Granulocytes: 0.15 10*3/uL — ABNORMAL HIGH (ref 0.00–0.07)
Basophils Absolute: 0.1 10*3/uL (ref 0.0–0.1)
Basophils Relative: 1 %
Eosinophils Absolute: 0.2 10*3/uL (ref 0.0–0.5)
Eosinophils Relative: 2 %
HCT: 45 % (ref 39.0–52.0)
Hemoglobin: 15.3 g/dL (ref 13.0–17.0)
Immature Granulocytes: 1 %
Lymphocytes Relative: 40 %
Lymphs Abs: 4.3 10*3/uL — ABNORMAL HIGH (ref 0.7–4.0)
MCH: 33.6 pg (ref 26.0–34.0)
MCHC: 34 g/dL (ref 30.0–36.0)
MCV: 98.7 fL (ref 80.0–100.0)
Monocytes Absolute: 1.1 10*3/uL — ABNORMAL HIGH (ref 0.1–1.0)
Monocytes Relative: 10 %
Neutro Abs: 5 10*3/uL (ref 1.7–7.7)
Neutrophils Relative %: 46 %
Platelets: 179 10*3/uL (ref 150–400)
RBC: 4.56 MIL/uL (ref 4.22–5.81)
RDW: 11.8 % (ref 11.5–15.5)
WBC: 10.9 10*3/uL — ABNORMAL HIGH (ref 4.0–10.5)
nRBC: 0 % (ref 0.0–0.2)

## 2020-01-02 LAB — BASIC METABOLIC PANEL
Anion gap: 10 (ref 5–15)
BUN: 14 mg/dL (ref 6–20)
CO2: 24 mmol/L (ref 22–32)
Calcium: 9 mg/dL (ref 8.9–10.3)
Chloride: 107 mmol/L (ref 98–111)
Creatinine, Ser: 0.79 mg/dL (ref 0.61–1.24)
GFR, Estimated: >60 mL/min (ref >60)
Glucose, Bld: 119 mg/dL — ABNORMAL HIGH (ref 70–99)
Potassium: 3.9 mmol/L (ref 3.5–5.1)
Sodium: 141 mmol/L (ref 135–145)

## 2020-01-02 LAB — BRAIN NATRIURETIC PEPTIDE: B Natriuretic Peptide: 23.2 pg/mL (ref 0.0–100.0)

## 2020-01-02 LAB — ETHANOL: Alcohol, Ethyl (B): 187 mg/dL — ABNORMAL HIGH (ref <10)

## 2020-01-02 LAB — TROPONIN I (HIGH SENSITIVITY): Troponin I (High Sensitivity): 8 ng/L (ref <18)

## 2020-01-02 LAB — D-DIMER, QUANTITATIVE: D-Dimer, Quant: 0.41 ug/mL-FEU (ref 0.00–0.50)

## 2020-01-02 NOTE — ED Provider Notes (Signed)
[name] EMERGENCY DEPARTMENT Provider Note   CSN: [id number] Arrival date & time: 01/02/20  0531     History Chief Complaint  Patient presents with  . Covid Positive  . Loss of Consciousness    Larry Rojas is a 34 y.o. male.  Patient presents to the emergency department for evaluation of syncopal episodes.  Patient has been ill for some time.  He was diagnosed with Covid 10 days ago.  He has had cough, congestion, generalized weakness.  He has had progressively worsening shortness of breath.        History reviewed. No pertinent past medical history.  Patient Active Problem List   Diagnosis Date Noted  . Encephalopathy acute 10/25/2018  . Glasgow coma scale total score 3-8 (HCC)   . Endotracheally intubated   . Hearing loss   . Acute encephalopathy   . Alcohol abuse   . Acute respiratory failure with hypoxia (HCC)     History reviewed. No pertinent surgical history.     History reviewed. No pertinent family history.  Social History   Tobacco Use  . Smoking status: Never Smoker  . Smokeless tobacco: Never Used  Substance Use Topics  . Alcohol use: Yes    Comment: socially  . Drug use: Not Currently    Home Medications Prior to Admission medications   Medication Sig Start Date End Date Taking? Authorizing Provider  amLODipine (NORVASC) 5 MG tablet Take 1 tablet (5 mg total) by mouth daily. 10/26/18   Rojas, Larry P, PA-C  ibuprofen (ADVIL,MOTRIN) 800 MG tablet Take 1 tablet (800 mg total) by mouth 3 (three) times daily. 03/31/18   Larry Areas, PA-C  methocarbamol (ROBAXIN) 500 MG tablet Take 1 tablet (500 mg total) by mouth 2 (two) times daily. 10/02/17   Larry Caul, PA-C  methocarbamol (ROBAXIN) 500 MG tablet Take 500 mg by mouth every 6 (six) hours as needed for muscle spasms.    [provider]  naproxen (NAPROSYN) 500 MG tablet Take 1 tablet (500 mg total) by mouth 2 (two) times daily. 10/02/17   Larry Caul, PA-C  traMADol (ULTRAM) 50 MG tablet Take 50 mg by mouth every 6 (six) hours as needed for moderate pain.    [provider]    Allergies    Fluoxetine and Trazodone and nefazodone  Review of Systems   Review of Systems  Physical Exam Updated Vital Signs BP 134/78   Pulse 84   Temp 97.8 F (36.6 C) (Oral)   Resp 16   Ht 6\' 1"  (1.854 m)   Wt 113.4 kg   SpO2 94%   BMI 32.98 kg/m   Physical Exam  ED Results / Procedures / Treatments   Labs (all labs ordered are listed, but only abnormal results are displayed) Labs Reviewed  CBC WITH DIFFERENTIAL/PLATELET - Abnormal; Notable for the following components:      Result Value   WBC 10.9 (*)    Lymphs Abs 4.3 (*)    Monocytes Absolute 1.1 (*)    Abs Immature Granulocytes 0.15 (*)    All other components within normal limits  BASIC METABOLIC PANEL - Abnormal; Notable for the following components:   Glucose, Bld 119 (*)    All other components within normal limits  ETHANOL - Abnormal; Notable for the following components:   Alcohol, Ethyl (B) 187 (*)    All other components within normal limits  D-DIMER, QUANTITATIVE (NOT AT [facility])  BRAIN NATRIURETIC PEPTIDE  TROPONIN I (HIGH SENSITIVITY)  TROPONIN I (HIGH SENSITIVITY)    EKG EKG Interpretation  Date/Time:  Friday January 02 2020 05:38:05 EST Ventricular Rate:  83 PR Interval:    QRS Duration: 109 QT Interval:  381 QTC Calculation: 448 R Axis:   53 Text Interpretation: Sinus rhythm RSR' in V1 or V2, right VCD or RVH ST elev, probable normal early repol pattern Confirmed by Larry Rojas [phone]) on 01/02/2020 5:40:32 AM   Radiology DG Chest Port 1 View  Result Date: 01/02/2020 CLINICAL DATA:  Shortness of breath. EXAM: PORTABLE CHEST 1 VIEW COMPARISON:  10/31/2018. FINDINGS: Mediastinum hilar structures normal. Heart size stable. Low lung volumes with mild bibasilar atelectasis. No pleural effusion or pneumothorax. Thoracic spine scoliosis  concave left. No acute bony abnormality identified. IMPRESSION: Low lung volumes with mild bibasilar atelectasis. Electronically Signed   By: Larry Fus  Register   On: 01/02/2020 05:57    Procedures Procedures (including critical care time)  Medications Ordered in ED Medications - No data to display  ED Course  I have reviewed the triage vital signs and the nursing notes.  Pertinent labs & imaging results that were available during my care of the patient were reviewed by me and considered in my medical decision making (see chart for details).    MDM Rules/Calculators/A&P                          Patient presents to the emergency department for evaluation of persistent cough in the setting of Covid.  He apparently had a syncopal episode at home earlier tonight.  Patient's vital signs revealed no significant hypoxia.  Chest x-ray does not show any evidence of pneumonia.  Patient is not tachycardic or tachypneic.  D-dimer is normal.  Troponin is normal, EKG unremarkable.  No concern for PE or myocarditis.  Patient is intoxicated, likely at least in part responsible for his syncope.  No further work-up necessary at this time.  Final Clinical Impression(s) / ED Diagnoses Final diagnoses:  Syncope, unspecified syncope type  COVID-19  Alcoholic intoxication without complication [facility])    Rx / DC Orders ED Discharge Orders    None       Larry Rojas, Larry Brim, MD 01/02/20 0710

## 2020-01-02 NOTE — ED Triage Notes (Signed)
Patient BIB by [facility] from home, COVID + 10 days ago, reports shortness of breath, sore throat, cough, EMS called by girlfriend for reports of 2 syncope episodes tonight.  EMS vitals  138/72 82 HR 100% RA 97.1 ORAL 20 RR CBG 104 18 L hand

## 2020-01-06 ENCOUNTER — Other Ambulatory Visit: Payer: Self-pay

## 2020-01-06 ENCOUNTER — Encounter (HOSPITAL_COMMUNITY): Payer: Self-pay

## 2020-01-06 ENCOUNTER — Emergency Department (HOSPITAL_COMMUNITY)
Admission: EM | Admit: 2020-01-06 | Discharge: 2020-01-06 | Disposition: A | Discharge status: Home / Self Care | Payer: Medicaid Other | Attending: Emergency Medicine | Admitting: Emergency Medicine

## 2020-01-06 DIAGNOSIS — R000 Tachycardia, unspecified: Secondary | ICD-10-CM | POA: Insufficient documentation

## 2020-01-06 DIAGNOSIS — J029 Acute pharyngitis, unspecified: Secondary | ICD-10-CM | POA: Insufficient documentation

## 2020-01-06 DIAGNOSIS — L539 Erythematous condition, unspecified: Secondary | ICD-10-CM | POA: Diagnosis not present

## 2020-01-06 DIAGNOSIS — R070 Pain in throat: Secondary | ICD-10-CM | POA: Diagnosis present

## 2020-01-06 LAB — GROUP A STREP BY PCR: Group A Strep by PCR: NOT DETECTED

## 2020-01-06 MED ORDER — ACETAMINOPHEN 500 MG PO TABS
1000.0000 mg | ORAL_TABLET | Freq: Once | ORAL | Status: AC
  Administered 2020-01-06: 1000 mg via ORAL
  Filled 2020-01-06: qty 2

## 2020-01-06 MED ORDER — LACTATED RINGERS IV BOLUS
1000.0000 mL | Freq: Once | INTRAVENOUS | Status: AC
  Administered 2020-01-06: 1000 mL via INTRAVENOUS

## 2020-01-06 MED ORDER — KETOROLAC TROMETHAMINE 15 MG/ML IJ SOLN
15.0000 mg | Freq: Once | INTRAMUSCULAR | Status: AC
  Administered 2020-01-06: 15 mg via INTRAVENOUS
  Filled 2020-01-06: qty 1

## 2020-01-06 MED ORDER — DEXAMETHASONE SODIUM PHOSPHATE 10 MG/ML IJ SOLN
10.0000 mg | Freq: Once | INTRAMUSCULAR | Status: AC
  Administered 2020-01-06: 10 mg via INTRAVENOUS
  Filled 2020-01-06: qty 1

## 2020-01-06 NOTE — ED Provider Notes (Signed)
[facility]-EMERGENCY DEPT Provider Note   CSN: [id number] Arrival date & time: 01/06/20  1519     History Chief Complaint  Patient presents with  . Sore Throat  . Covid Positive    Larry Rojas is a 34 y.o. male.  HPI      Larry Rojas is a 34 y.o. male, with a history of recent Covid infection, presenting to the ED primarily complaining of sore throat and fever for the last 3 days. Pain is burning, severe, bilateral, nonradiating.  Patient states he was diagnosed with Covid November 8.  He had cough, fever, shortness of breath during that time.  The symptoms have largely resolved, though he does have a cough occasionally.  Denies new headache, drooling, shortness of breath, chest pain, abdominal pain, N/V/D, rash, or any other complaints.   History reviewed. No pertinent past medical history.  Patient Active Problem List   Diagnosis Date Noted  . Encephalopathy acute 10/25/2018  . Glasgow coma scale total score 3-8 (HCC)   . Endotracheally intubated   . Hearing loss   . Acute encephalopathy   . Alcohol abuse   . Acute respiratory failure with hypoxia (HCC)     History reviewed. No pertinent surgical history.     No family history on file.  Social History   Tobacco Use  . Smoking status: Never Smoker  . Smokeless tobacco: Never Used  Substance Use Topics  . Alcohol use: Yes    Comment: socially  . Drug use: Not Currently    Home Medications Prior to Admission medications   Medication Sig Start Date End Date Taking? Authorizing Provider  amLODipine (NORVASC) 5 MG tablet Take 1 tablet (5 mg total) by mouth daily. 10/26/18   Desai, Rahul P, PA-C  ibuprofen (ADVIL,MOTRIN) 800 MG tablet Take 1 tablet (800 mg total) by mouth 3 (three) times daily. 03/31/18   Elson Areas, PA-C  methocarbamol (ROBAXIN) 500 MG tablet Take 1 tablet (500 mg total) by mouth 2 (two) times daily. 10/02/17   Maxwell Caul, PA-C  methocarbamol  (ROBAXIN) 500 MG tablet Take 500 mg by mouth every 6 (six) hours as needed for muscle spasms.    [provider]  naproxen (NAPROSYN) 500 MG tablet Take 1 tablet (500 mg total) by mouth 2 (two) times daily. 10/02/17   Maxwell Caul, PA-C  traMADol (ULTRAM) 50 MG tablet Take 50 mg by mouth every 6 (six) hours as needed for moderate pain.    [provider]    Allergies    Fluoxetine and Trazodone and nefazodone  Review of Systems   Review of Systems  Constitutional: Positive for fever.  HENT: Positive for sore throat. Negative for facial swelling and voice change.   Respiratory: Negative for shortness of breath.   Cardiovascular: Negative for chest pain.  Gastrointestinal: Negative for abdominal pain, diarrhea, nausea and vomiting.  Musculoskeletal: Negative for neck pain and neck stiffness.  All other systems reviewed and are negative.   Physical Exam Updated Vital Signs BP (!) 156/97   Pulse (!) 101   Temp (!) 101.1 F (38.4 C)   Resp 17   SpO2 99%   Physical Exam Vitals and nursing note reviewed.  Constitutional:      General: He is not in acute distress.    Appearance: He is well-developed. He is not diaphoretic.  HENT:     Head: Normocephalic and atraumatic.     Mouth/Throat:     Mouth:  Mucous membranes are moist.     Pharynx: Uvula midline. Pharyngeal swelling and posterior oropharyngeal erythema present. No oropharyngeal exudate.     Comments: No noted area of intraoral swelling or fluctuance.  No trismus or noted abnormal phonation.  Mouth opening to at least 3 finger widths.  Handles oral secretions without difficulty.  No noted facial swelling.  No sublingual swelling or tongue elevation.  No swelling or tenderness to the submental or submandibular regions.  No swelling or tenderness into the soft tissues of the neck. Eyes:     Conjunctiva/sclera: Conjunctivae normal.  Cardiovascular:     Rate and Rhythm: Regular rhythm. Tachycardia present.      Pulses: Normal pulses.          Radial pulses are 2+ on the right side and 2+ on the left side.     Heart sounds: Normal heart sounds.     Comments: Mildly tachycardic. Pulmonary:     Effort: Pulmonary effort is normal. No respiratory distress.     Breath sounds: Normal breath sounds.     Comments: Patient able to lie supine without noted difficulty.  No increased work of breathing.  Speaks in full sentences without difficulty. Abdominal:     Palpations: Abdomen is soft.     Tenderness: There is no abdominal tenderness. There is no guarding.  Musculoskeletal:     Cervical back: Neck supple.  Lymphadenopathy:     Cervical: No cervical adenopathy.  Skin:    General: Skin is warm and dry.  Neurological:     Mental Status: He is alert.  Psychiatric:        Mood and Affect: Mood and affect normal.        Speech: Speech normal.        Behavior: Behavior normal.     ED Results / Procedures / Treatments   Labs (all labs ordered are listed, but only abnormal results are displayed) Labs Reviewed  GROUP A STREP BY PCR    EKG None  Radiology No results found.  Procedures Procedures (including critical care time)  Medications Ordered in ED Medications  lactated ringers bolus 1,000 mL (0 mLs Intravenous Stopped 01/06/20 1800)  dexamethasone (DECADRON) injection 10 mg (10 mg Intravenous Given 01/06/20 1642)  ketorolac (TORADOL) 15 MG/ML injection 15 mg (15 mg Intravenous Given 01/06/20 1643)  acetaminophen (TYLENOL) tablet 1,000 mg (1,000 mg Oral Given 01/06/20 1638)    ED Course  I have reviewed the triage vital signs and the nursing notes.  Pertinent labs & imaging results that were available during my care of the patient were reviewed by me and considered in my medical decision making (see chart for details).    MDM Rules/Calculators/A&P                          Patient presents with sore throat and fever.  My suspicion for sepsis, Ludwig's angioedema, or other  emergent condition is low based on patient's overall presentation. Patient is febrile with mild tachycardia, but nontoxic-appearing, not hypotensive, and maintains excellent SPO2 on room air.  Tachycardia resolved with fever management. Strep swab negative. The patient was given instructions for home care as well as return precautions. Patient voices understanding of these instructions, accepts the plan, and is comfortable with discharge.  I reviewed and interpreted the patient's labs.   Vitals:   01/06/20 1815 01/06/20 1830 01/06/20 1842 01/06/20 1848  BP: (!) 152/81 (!) 164/84  (!) 164/84  Pulse: 87  86 86  Resp: 16   16  Temp:      SpO2: 97%  97% 97%     Final Clinical Impression(s) / ED Diagnoses Final diagnoses:  Sore throat    Rx / DC Orders ED Discharge Orders    None       Concepcion Living 01/06/20 1925    Terrilee Files, MD 01/07/20 [phone]

## 2020-01-06 NOTE — Discharge Instructions (Signed)
Sore Throat  You have been seen today for sore throat.  The strep test was negative.  This usually indicates a viral infection.  Viral infections do not respond to antibiotics.  Your body has to fight off the infection and it needs to run its course. Hand washing: Wash your hands throughout the day, but especially before and after touching the face, using the restroom, sneezing, coughing, or touching surfaces that have been coughed or sneezed upon. Hydration: Symptoms will be intensified and complicated by dehydration. Dehydration can also extend the duration of symptoms. Drink plenty of fluids and get plenty of rest. You should be drinking at least half a liter of water an hour to stay hydrated. Electrolyte drinks (ex. Gatorade, Powerade, Pedialyte) are also encouraged. You should be drinking enough fluids to make your urine light yellow, almost clear. If this is not the case, you are not drinking enough water. Please note that some of the treatments indicated below will not be effective if you are not adequately hydrated. Diet: Please concentrate on hydration, however, you may introduce food slowly.  Start with a clear liquid diet, progressed to a full liquid diet, and then bland solids as you are able. Pain or fever: Ibuprofen, Naproxen, or Tylenol for pain or fever. (see below for suggested regimen) Antiinflammatory medications: Take 600 mg of ibuprofen every 6 hours or 440 mg (over the counter dose) to 500 mg (prescription dose) of naproxen every 12 hours for the next 3 days. After this time, these medications may be used as needed for pain. Take these medications with food to avoid upset stomach. Choose only one of these medications, do not take them together. Tylenol: Should you continue to have additional pain while taking the ibuprofen or naproxen, you may add in tylenol as needed. Your daily total maximum amount of tylenol from all sources should be limited to 4000mg/day for persons without liver  problems, or 2000mg/day for those with liver problems. Sore throat: Warm liquids or Chloraseptic spray may help soothe a sore throat. Gargle twice a day with a salt water solution made from a half teaspoon of salt in a cup of warm water.  Follow up: Follow up with a primary care provider, as needed, for any future management of this issue.  For prescription assistance, may try using prescription discount sites or apps, such as goodrx.com 

## 2020-01-06 NOTE — ED Triage Notes (Addendum)
Pt states he was dx with COVID 11/9. Pt reports that he is having pain in his throat and states that he cannot swallow. Pt is swallowing his saliva and speaking without any difficulty.  Pt states that he has had a fever but has not taken any meds.

## 2021-04-05 ENCOUNTER — Encounter (HOSPITAL_BASED_OUTPATIENT_CLINIC_OR_DEPARTMENT_OTHER): Payer: Self-pay

## 2021-04-05 ENCOUNTER — Other Ambulatory Visit: Payer: Self-pay

## 2021-04-05 ENCOUNTER — Emergency Department (HOSPITAL_BASED_OUTPATIENT_CLINIC_OR_DEPARTMENT_OTHER): Payer: Medicaid Other

## 2021-04-05 ENCOUNTER — Emergency Department (HOSPITAL_BASED_OUTPATIENT_CLINIC_OR_DEPARTMENT_OTHER)
Admission: EM | Admit: 2021-04-05 | Discharge: 2021-04-05 | Disposition: A | Discharge status: Home / Self Care | Payer: Medicaid Other | Attending: Emergency Medicine | Admitting: Emergency Medicine

## 2021-04-05 ENCOUNTER — Other Ambulatory Visit (HOSPITAL_BASED_OUTPATIENT_CLINIC_OR_DEPARTMENT_OTHER): Payer: Self-pay

## 2021-04-05 DIAGNOSIS — W108XXA Fall (on) (from) other stairs and steps, initial encounter: Secondary | ICD-10-CM

## 2021-04-05 DIAGNOSIS — S299XXA Unspecified injury of thorax, initial encounter: Secondary | ICD-10-CM | POA: Diagnosis not present

## 2021-04-05 DIAGNOSIS — S199XXA Unspecified injury of neck, initial encounter: Secondary | ICD-10-CM | POA: Diagnosis present

## 2021-04-05 DIAGNOSIS — S0990XA Unspecified injury of head, initial encounter: Secondary | ICD-10-CM | POA: Diagnosis not present

## 2021-04-05 DIAGNOSIS — W109XXA Fall (on) (from) unspecified stairs and steps, initial encounter: Secondary | ICD-10-CM | POA: Diagnosis not present

## 2021-04-05 DIAGNOSIS — S161XXA Strain of muscle, fascia and tendon at neck level, initial encounter: Secondary | ICD-10-CM | POA: Diagnosis not present

## 2021-04-05 MED ORDER — OXYCODONE-ACETAMINOPHEN 5-325 MG PO TABS
2.0000 | ORAL_TABLET | Freq: Once | ORAL | Status: AC
  Administered 2021-04-05: 2 via ORAL
  Filled 2021-04-05: qty 2

## 2021-04-05 MED ORDER — NAPROXEN 500 MG PO TABS
500.0000 mg | ORAL_TABLET | Freq: Two times a day (BID) | ORAL | 0 refills | Status: DC
  Filled 2021-04-05: qty 10, 5d supply, fill #0

## 2021-04-05 MED ORDER — METHOCARBAMOL 500 MG PO TABS
500.0000 mg | ORAL_TABLET | Freq: Two times a day (BID) | ORAL | 0 refills | Status: DC
  Filled 2021-04-05: qty 20, 10d supply, fill #0

## 2021-04-05 NOTE — ED Notes (Signed)
Patient placed in C-Collar for Precautions and Comfort.

## 2021-04-05 NOTE — ED Provider Notes (Signed)
Care of the patient assumed at the change of shift after falling down stairs yesterday. Awaiting imaging.  Physical Exam  BP (!) 141/84    Pulse 62    Temp 98.8 F (37.1 C) (Oral)    Resp 18    Ht 6\' 1"  (1.854 m)    Wt 113.4 kg    SpO2 99%    BMI 32.98 kg/m   Physical Exam Awake and alert Diffuse soft tissue cervical tenderness, no deformity Moves all extremities.  Procedures  Procedures  ED Course / MDM   Clinical Course as of 04/05/21 1554  Tue Apr 05, 2021  1531 I personally viewed the images from radiology studies and agree with radiologist interpretation: CT neg for acute injury.   [CS]  1551 Patient reports pain is improved after percocet. CT results reviewed with the patient. C-collar removed. Will d/c with Naprosyn, Robaxin and PCP follow up. Advised he would be sore for a few days. RTED for any other concerns.  [CS]    Clinical Course User Index [CS] Apr 07, 2021, MD   Medical Decision Making Given presenting complaint, I considered that admission might be necessary. After review of results from ED lab and/or imaging studies, admission to the hospital is not indicated at this time.    Problems Addressed: Cervical strain, acute, initial encounter: acute illness or injury Fall down stairs, initial encounter: acute illness or injury  Amount and/or Complexity of Data Reviewed Radiology: ordered and independent interpretation performed. Decision-making details documented in ED Course.  Risk Prescription drug management. Decision regarding hospitalization.          Pollyann Savoy, MD 04/05/21 [phone]
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
## 2021-04-05 NOTE — ED Triage Notes (Signed)
Patient here POV from Home with Neck Pain.  States falling down approximately 15-20 steps at Home playing with Children yesterday. Endorses Posterior Neck Pain radiating to Mid Upper Back.  No LOC. No Blood Thinners.    NAD Noted during Triage. A&Ox4. GCS 15. Ambulatory.

## 2021-04-18 ENCOUNTER — Other Ambulatory Visit (HOSPITAL_BASED_OUTPATIENT_CLINIC_OR_DEPARTMENT_OTHER): Payer: Self-pay

## 2021-08-22 ENCOUNTER — Other Ambulatory Visit: Payer: Self-pay

## 2021-08-22 ENCOUNTER — Emergency Department (HOSPITAL_BASED_OUTPATIENT_CLINIC_OR_DEPARTMENT_OTHER)
Admission: EM | Admit: 2021-08-22 | Discharge: 2021-08-23 | Disposition: A | Discharge status: Home / Self Care | Payer: Medicaid Other | Attending: Emergency Medicine | Admitting: Emergency Medicine

## 2021-08-22 DIAGNOSIS — R7401 Elevation of levels of liver transaminase levels: Secondary | ICD-10-CM | POA: Insufficient documentation

## 2021-08-22 DIAGNOSIS — I10 Essential (primary) hypertension: Secondary | ICD-10-CM

## 2021-08-22 DIAGNOSIS — Z79899 Other long term (current) drug therapy: Secondary | ICD-10-CM | POA: Diagnosis not present

## 2021-08-22 DIAGNOSIS — E876 Hypokalemia: Secondary | ICD-10-CM | POA: Diagnosis not present

## 2021-08-22 DIAGNOSIS — K5732 Diverticulitis of large intestine without perforation or abscess without bleeding: Secondary | ICD-10-CM | POA: Diagnosis not present

## 2021-08-22 DIAGNOSIS — R1013 Epigastric pain: Secondary | ICD-10-CM | POA: Diagnosis present

## 2021-08-22 HISTORY — DX: Dorsalgia, unspecified: M54.9

## 2021-08-22 HISTORY — DX: Disorder of thyroid, unspecified: E07.9

## 2021-08-22 HISTORY — DX: Essential (primary) hypertension: I10

## 2021-08-22 HISTORY — DX: Prediabetes: R73.03

## 2021-08-22 HISTORY — DX: Unspecified asthma, uncomplicated: J45.909

## 2021-08-22 NOTE — ED Triage Notes (Signed)
Pt via POV c/o left sided abdominal pain onset Saturday afternoon. Pt sts "I feel like I have a weight on my stomach." Endorses a stabbing pain worsening with movement. Pain not associated with any other symptoms. No GI Hx and No surgeries.

## 2021-08-23 ENCOUNTER — Encounter (HOSPITAL_BASED_OUTPATIENT_CLINIC_OR_DEPARTMENT_OTHER): Payer: Self-pay

## 2021-08-23 ENCOUNTER — Emergency Department (HOSPITAL_BASED_OUTPATIENT_CLINIC_OR_DEPARTMENT_OTHER): Payer: Medicaid Other

## 2021-08-23 DIAGNOSIS — K5792 Diverticulitis of intestine, part unspecified, without perforation or abscess without bleeding: Secondary | ICD-10-CM

## 2021-08-23 HISTORY — DX: Diverticulitis of intestine, part unspecified, without perforation or abscess without bleeding: K57.92

## 2021-08-23 LAB — URINALYSIS, ROUTINE W REFLEX MICROSCOPIC
Bilirubin Urine: NEGATIVE
Glucose, UA: NEGATIVE mg/dL
Hgb urine dipstick: NEGATIVE
Leukocytes,Ua: NEGATIVE
Nitrite: NEGATIVE
Protein, ur: NEGATIVE mg/dL
Specific Gravity, Urine: 1.019 (ref 1.005–1.030)
pH: 6 (ref 5.0–8.0)

## 2021-08-23 LAB — CBC WITH DIFFERENTIAL/PLATELET
Abs Immature Granulocytes: 0.03 10*3/uL (ref 0.00–0.07)
Basophils Absolute: 0 10*3/uL (ref 0.0–0.1)
Basophils Relative: 0 %
Eosinophils Absolute: 0.1 10*3/uL (ref 0.0–0.5)
Eosinophils Relative: 1 %
HCT: 39.9 % (ref 39.0–52.0)
Hemoglobin: 14.4 g/dL (ref 13.0–17.0)
Immature Granulocytes: 0 %
Lymphocytes Relative: 31 %
Lymphs Abs: 3.2 10*3/uL (ref 0.7–4.0)
MCH: 35 pg — ABNORMAL HIGH (ref 26.0–34.0)
MCHC: 36.1 g/dL — ABNORMAL HIGH (ref 30.0–36.0)
MCV: 96.8 fL (ref 80.0–100.0)
Monocytes Absolute: 1 10*3/uL (ref 0.1–1.0)
Monocytes Relative: 10 %
Neutro Abs: 5.8 10*3/uL (ref 1.7–7.7)
Neutrophils Relative %: 58 %
Platelets: 159 10*3/uL (ref 150–400)
RBC: 4.12 MIL/uL — ABNORMAL LOW (ref 4.22–5.81)
RDW: 11.5 % (ref 11.5–15.5)
WBC: 10.1 10*3/uL (ref 4.0–10.5)
nRBC: 0 % (ref 0.0–0.2)

## 2021-08-23 LAB — RAPID URINE DRUG SCREEN, HOSP PERFORMED
Amphetamines: NOT DETECTED
Barbiturates: NOT DETECTED
Benzodiazepines: POSITIVE — AB
Cocaine: NOT DETECTED
Opiates: NOT DETECTED
Tetrahydrocannabinol: POSITIVE — AB

## 2021-08-23 LAB — COMPREHENSIVE METABOLIC PANEL
ALT: 55 U/L — ABNORMAL HIGH (ref 0–44)
AST: 36 U/L (ref 15–41)
Albumin: 4.6 g/dL (ref 3.5–5.0)
Alkaline Phosphatase: 60 U/L (ref 38–126)
Anion gap: 12 (ref 5–15)
BUN: 9 mg/dL (ref 6–20)
CO2: 24 mmol/L (ref 22–32)
Calcium: 9.5 mg/dL (ref 8.9–10.3)
Chloride: 103 mmol/L (ref 98–111)
Creatinine, Ser: 0.86 mg/dL (ref 0.61–1.24)
GFR, Estimated: >60 mL/min (ref >60)
Glucose, Bld: 103 mg/dL — ABNORMAL HIGH (ref 70–99)
Potassium: 3.3 mmol/L — ABNORMAL LOW (ref 3.5–5.1)
Sodium: 139 mmol/L (ref 135–145)
Total Bilirubin: 0.9 mg/dL (ref 0.3–1.2)
Total Protein: 7.1 g/dL (ref 6.5–8.1)

## 2021-08-23 LAB — LIPASE, BLOOD: Lipase: 27 U/L (ref 11–51)

## 2021-08-23 MED ORDER — METRONIDAZOLE 500 MG PO TABS: 500.0000 mg | ORAL_TABLET | Freq: Three times a day (TID) | ORAL | 0 refills | Status: DC

## 2021-08-23 MED ORDER — POTASSIUM CHLORIDE CRYS ER 20 MEQ PO TBCR: 20.0000 meq | EXTENDED_RELEASE_TABLET | Freq: Two times a day (BID) | ORAL | 0 refills | Status: DC

## 2021-08-23 MED ORDER — METRONIDAZOLE 500 MG PO TABS
500.0000 mg | ORAL_TABLET | Freq: Once | ORAL | Status: AC
Start: 2021-08-23 — End: 2021-08-23
  Administered 2021-08-23: 500 mg via ORAL
  Filled 2021-08-23: qty 1

## 2021-08-23 MED ORDER — IOHEXOL 300 MG/ML  SOLN
100.0000 mL | Freq: Once | INTRAMUSCULAR | Status: AC | PRN
  Administered 2021-08-23: 100 mL via INTRAVENOUS

## 2021-08-23 MED ORDER — CIPROFLOXACIN HCL 500 MG PO TABS: 500.0000 mg | ORAL_TABLET | Freq: Two times a day (BID) | ORAL | 0 refills | Status: DC

## 2021-08-23 MED ORDER — CIPROFLOXACIN HCL 500 MG PO TABS
500.0000 mg | ORAL_TABLET | Freq: Once | ORAL | Status: AC
  Administered 2021-08-23: 500 mg via ORAL
  Filled 2021-08-23: qty 1

## 2021-08-23 MED ORDER — POTASSIUM CHLORIDE CRYS ER 20 MEQ PO TBCR
40.0000 meq | EXTENDED_RELEASE_TABLET | Freq: Once | ORAL | Status: AC
  Administered 2021-08-23: 40 meq via ORAL
  Filled 2021-08-23: qty 2

## 2021-08-23 NOTE — ED Notes (Signed)
Patient transported to CT 

## 2021-08-23 NOTE — ED Notes (Signed)
Pt now returned from CT dept - ambulatory with steady gait; no acute changes observed

## 2021-08-23 NOTE — Discharge Instructions (Addendum)
You may take acetaminophen or ibuprofen as needed for pain.  Return if pain is getting worse, you start vomiting, or start running a high fever.

## 2021-08-23 NOTE — ED Notes (Addendum)
Late entry- Upon discussing ABX ordered for diverticulitis diagnosis pt reports he remembers having diverticulitis in the past but had forgotten.  Pt subsequently provided diverticulitis handout to read over

## 2021-08-23 NOTE — ED Provider Notes (Signed)
[facility] EMERGENCY DEPT Provider Note   CSN: [id number] Arrival date & time: 08/22/21  2334     History  Chief Complaint  Patient presents with   Abdominal Pain    Larry Rojas is a 35 y.o. male.  The history is provided by the patient.  Abdominal Pain He has history of hypertension and comes in complaining of left-sided abdominal pain for the last 2 days.  He describes a heavy feeling across the periumbilical area and epigastric area and a sharp pain in the left upper abdomen.  The sharp pain is worse with movement, deep breaths, laughing.  He had been taking acetaminophen without relief, but stopped taking it when a nurse he knows told him that it was bad for ulcers and that he might have an ulcer.  He denies NSAID use and denies ethanol use.  He denies nausea, vomiting, diarrhea.  He denies any fever or chills.   Home Medications Prior to Admission medications   Medication Sig Start Date End Date Taking? Authorizing Provider  amLODipine (NORVASC) 5 MG tablet Take 1 tablet (5 mg total) by mouth daily. 10/26/18   Desai, Rahul P, PA-C  clonazePAM (KLONOPIN) 0.5 MG tablet Take 0.5 mg by mouth in the morning, at noon, and at bedtime. 05/31/20   [provider]  levothyroxine (SYNTHROID) 25 MCG tablet Take 25 mcg by mouth daily. 02/11/21   [provider]  methocarbamol (ROBAXIN) 500 MG tablet Take 1 tablet (500 mg total) by mouth 2 (two) times daily. 04/05/21   Pollyann Savoy, MD  naproxen (NAPROSYN) 500 MG tablet Take 1 tablet (500 mg total) by mouth 2 (two) times daily. 04/05/21   Pollyann Savoy, MD  niacin (NIASPAN) 500 MG CR tablet Take 500 mg by mouth daily. 03/31/21   [provider]  omeprazole (PRILOSEC) 20 MG capsule Take 20 mg by mouth daily as needed.    [provider]      Allergies    Fluoxetine and Trazodone and nefazodone    Review of Systems   Review of Systems  Gastrointestinal:  Positive for abdominal  pain.  All other systems reviewed and are negative.   Physical Exam Updated Vital Signs BP (!) 160/94   Pulse 94   Temp 98.5 F (36.9 C) (Oral)   Resp 18   Ht 6\' 1"  (1.854 m)   Wt 113.4 kg   SpO2 96%   BMI 32.98 kg/m  Physical Exam Vitals and nursing note reviewed.   36 year old male, resting comfortably and in no acute distress. Vital signs are significant for elevated blood pressure. Oxygen saturation is 96%, which is normal. Head is normocephalic and atraumatic. PERRLA, EOMI. Oropharynx is clear. Neck is nontender and supple without adenopathy or JVD. Back is nontender and there is no CVA tenderness. Lungs are clear without rales, wheezes, or rhonchi. Chest is nontender. Heart has regular rate and rhythm without murmur. Abdomen is soft, flat, with marked tenderness in the epigastric area and the entire left side of the abdomen.  There is no rebound or guarding.  There is no hepatosplenomegaly and peristalsis is hypoactive. Extremities have no cyanosis or edema, full range of motion is present. Skin is warm and dry without rash. Neurologic: Mental status is normal, cranial nerves are intact, moves all extremities equally.  ED Results / Procedures / Treatments   Labs (all labs ordered are listed, but only abnormal results are displayed) Labs Reviewed  COMPREHENSIVE METABOLIC PANEL - Abnormal;  Notable for the following components:      Result Value   Potassium 3.3 (*)    Glucose, Bld 103 (*)    ALT 55 (*)    All other components within normal limits  CBC WITH DIFFERENTIAL/PLATELET - Abnormal; Notable for the following components:   RBC 4.12 (*)    MCH 35.0 (*)    MCHC 36.1 (*)    All other components within normal limits  URINALYSIS, ROUTINE W REFLEX MICROSCOPIC - Abnormal; Notable for the following components:   Ketones, ur TRACE (*)    All other components within normal limits  RAPID URINE DRUG SCREEN, HOSP PERFORMED - Abnormal; Notable for the following components:    Benzodiazepines POSITIVE (*)    Tetrahydrocannabinol POSITIVE (*)    All other components within normal limits  LIPASE, BLOOD   Radiology CT ABDOMEN PELVIS W CONTRAST  Result Date: 08/23/2021 CLINICAL DATA:  Left lower quadrant pain EXAM: CT ABDOMEN AND PELVIS WITH CONTRAST TECHNIQUE: Multidetector CT imaging of the abdomen and pelvis was performed using the standard protocol following bolus administration of intravenous contrast. RADIATION DOSE REDUCTION: This exam was performed according to the departmental dose-optimization program which includes automated exposure control, adjustment of the mA and/or kV according to patient size and/or use of iterative reconstruction technique. CONTRAST:  OMNIPAQUE IOHEXOL 300 MG/ML  SOLN COMPARISON:  None Available. FINDINGS: Lower chest: No acute abnormality Hepatobiliary: No focal hepatic abnormality. Gallbladder unremarkable. Pancreas: No focal abnormality or ductal dilatation. Spleen: No focal abnormality.  Normal size. Adrenals/Urinary Tract: No adrenal abnormality. No focal renal abnormality. No stones or hydronephrosis. Urinary bladder is unremarkable. Stomach/Bowel: Few scattered left colonic diverticula. Stranding along the posterior mid descending colon compatible with active diverticulitis. Stomach and small bowel decompressed, unremarkable. Vascular/Lymphatic: No evidence of aneurysm or adenopathy. Reproductive: No visible focal abnormality. Other: No free fluid or free air. Musculoskeletal: No acute bony abnormality. IMPRESSION: Scattered descending colonic diverticulosis. Stranding along the posterior mid descending colon compatible with active diverticulitis. Electronically Signed   By: Charlett Nose M.D.   On: 08/23/2021 01:42    Procedures Procedures  Cardiac monitor shows normal sinus rhythm, per my interpretation.  Medications Ordered in ED Medications  iohexol (OMNIPAQUE) 300 MG/ML solution 100 mL (100 mLs Intravenous Contrast Given  08/23/21 0109)  potassium chloride SA (KLOR-CON M) CR tablet 40 mEq (40 mEq Oral Given 08/23/21 0203)  ciprofloxacin (CIPRO) tablet 500 mg (500 mg Oral Given 08/23/21 0203)  metroNIDAZOLE (FLAGYL) tablet 500 mg (500 mg Oral Given 08/23/21 0203)    ED Course/ Medical Decision Making/ A&P                           Medical Decision Making Amount and/or Complexity of Data Reviewed Labs: ordered. Radiology: ordered.  Risk Prescription drug management.   Abdominal pain of uncertain cause.  Exam is benign.  Differential diagnosis is broad and includes, but is not limited to, peptic ulcer disease, GERD, pancreatitis, cholecystitis, diverticulitis, musculoskeletal pain.  Old records are reviewed, and he was admitted on 10/25/2018 for altered mental status from alcohol abuse.  I have ordered lab testing of CBC, comprehensive metabolic panel, lipase, urinalysis, urine drug screen and I have also ordered CT of abdomen and pelvis to look for possible diverticulitis.  I have reviewed and interpreted the laboratory tests and my interpretation is mild hypokalemia, normal WBC with normal differential, mild elevation of ALT which is not clinically significant.  Urinalysis was  significant only for trace ketones.  CT of abdomen and pelvis shows evidence of diverticulitis.  I have independently viewed the images, and agree with radiologist interpretation.  I have ordered a dose of oral potassium, ciprofloxacin, metronidazole and he is discharged with prescriptions for all 3 medications.  Patient was advised to abstain from alcohol while taking metronidazole.  Recommended follow-up with primary care provider in 2 weeks.  Return precautions discussed.  Final Clinical Impression(s) / ED Diagnoses Final diagnoses:  Diverticulitis of large intestine without perforation or abscess without bleeding  Hypokalemia  Elevated ALT measurement  Elevated blood pressure reading with diagnosis of hypertension    Rx / DC  Orders ED Discharge Orders          Ordered    ciprofloxacin (CIPRO) 500 MG tablet  2 times daily        08/23/21 0230    metroNIDAZOLE (FLAGYL) 500 MG tablet  3 times daily        08/23/21 0230    potassium chloride SA (KLOR-CON M) 20 MEQ tablet  2 times daily        08/23/21 0230              Dione Booze, MD 08/23/21 0234

## 2021-08-23 NOTE — ED Notes (Addendum)
Pt awake alert and oriented x4.  HOH at baseline.  Pt reports ongoing L side abd pain (distal to L lower anterior rib region) --TTP and worse with sneeze, cough, laughing, deep breath.  Pt denies associated n/v/d; denies urinary complaints; denies bloody stools.  Pt denies strenuous lifting/trauma - pt does report fall 2 weeks prior to ED visit but does not believe L side pain is related

## 2022-05-07 ENCOUNTER — Emergency Department (HOSPITAL_BASED_OUTPATIENT_CLINIC_OR_DEPARTMENT_OTHER)
Admission: EM | Admit: 2022-05-07 | Discharge: 2022-05-08 | Disposition: A | Discharge status: Home / Self Care | Payer: Medicaid Other | Attending: Emergency Medicine | Admitting: Emergency Medicine

## 2022-05-07 ENCOUNTER — Emergency Department (HOSPITAL_BASED_OUTPATIENT_CLINIC_OR_DEPARTMENT_OTHER): Payer: Medicaid Other | Admitting: Radiology

## 2022-05-07 ENCOUNTER — Other Ambulatory Visit: Payer: Self-pay

## 2022-05-07 DIAGNOSIS — R000 Tachycardia, unspecified: Secondary | ICD-10-CM | POA: Diagnosis not present

## 2022-05-07 DIAGNOSIS — J45909 Unspecified asthma, uncomplicated: Secondary | ICD-10-CM | POA: Diagnosis not present

## 2022-05-07 DIAGNOSIS — Z79899 Other long term (current) drug therapy: Secondary | ICD-10-CM | POA: Insufficient documentation

## 2022-05-07 DIAGNOSIS — R0789 Other chest pain: Secondary | ICD-10-CM | POA: Diagnosis present

## 2022-05-07 DIAGNOSIS — X509XXA Other and unspecified overexertion or strenuous movements or postures, initial encounter: Secondary | ICD-10-CM | POA: Diagnosis not present

## 2022-05-07 DIAGNOSIS — S20211A Contusion of right front wall of thorax, initial encounter: Secondary | ICD-10-CM | POA: Diagnosis not present

## 2022-05-07 DIAGNOSIS — Y92838 Other recreation area as the place of occurrence of the external cause: Secondary | ICD-10-CM | POA: Diagnosis not present

## 2022-05-07 DIAGNOSIS — I10 Essential (primary) hypertension: Secondary | ICD-10-CM | POA: Insufficient documentation

## 2022-05-07 MED ORDER — KETOROLAC TROMETHAMINE 60 MG/2ML IM SOLN
30.0000 mg | Freq: Once | INTRAMUSCULAR | Status: AC
  Administered 2022-05-07: 30 mg via INTRAMUSCULAR
  Filled 2022-05-07: qty 2
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
## 2022-05-07 NOTE — ED Provider Notes (Signed)
[facility] Provider Note  CSN: [id number] Arrival date & time: 05/07/22 2227  Chief Complaint(s) Shortness of Breath  HPI Larry Rojas is a 37 y.o. male {Add pertinent medical, surgical, social history, OB history to HPI:1}   The history is provided by the patient.    Past Medical History Past Medical History:  Diagnosis Date   Asthma    Back pain    Borderline diabetic    Diverticulitis 08/23/2021   Hypertension    Thyroid disease    Patient Active Problem List   Diagnosis Date Noted   Encephalopathy acute 10/25/2018   Glasgow coma scale total score 3-8 ([place])    Endotracheally intubated    Hearing loss    Acute encephalopathy    Alcohol abuse    Acute respiratory failure with hypoxia ([place])    Home Medication(s) Prior to Admission medications   Medication Sig Start Date End Date Taking? Authorizing Provider  amLODipine (NORVASC) 5 MG tablet Take 1 tablet (5 mg total) by mouth daily. 10/26/18   Desai, Rahul P, PA-C  ciprofloxacin (CIPRO) 500 MG tablet Take 1 tablet (500 mg total) by mouth 2 (two) times daily. [id number]   Delora Fuel, MD  clonazePAM (KLONOPIN) 0.5 MG tablet Take 0.5 mg by mouth in the morning, at noon, and at bedtime. 05/31/20   [provider]  levothyroxine (SYNTHROID) 25 MCG tablet Take 25 mcg by mouth daily. 02/11/21   [provider]  methocarbamol (ROBAXIN) 500 MG tablet Take 1 tablet (500 mg total) by mouth 2 (two) times daily. 04/05/21   Truddie Hidden, MD  metroNIDAZOLE (FLAGYL) 500 MG tablet Take 1 tablet (500 mg total) by mouth 3 (three) times daily. [id number]   Delora Fuel, MD  naproxen (NAPROSYN) 500 MG tablet Take 1 tablet (500 mg total) by mouth 2 (two) times daily. 04/05/21   Truddie Hidden, MD  niacin (NIASPAN) 500 MG CR tablet Take 500 mg by mouth daily. 03/31/21   [provider]  omeprazole (PRILOSEC) 20 MG capsule Take 20 mg by mouth daily as needed.     [provider]  potassium chloride SA (KLOR-CON M) 20 MEQ tablet Take 1 tablet (20 mEq total) by mouth 2 (two) times daily. [id number]   Delora Fuel, MD                                                                                                                                    Allergies Fluoxetine, Trazodone and nefazodone, and Hydrocodone  Review of Systems Review of Systems As noted in HPI  Physical Exam Vital Signs  I have reviewed the triage vital signs BP (!) 155/102 (BP Location: Right Arm)   Pulse (!) 111   Temp 97.8 F (36.6 C)   Resp 20   Ht 6\' 2"  (1.88 m)   Wt 113.4 kg   SpO2 100%   BMI  32.10 kg/m  *** Physical Exam Vitals reviewed.  Constitutional:      General: He is not in acute distress.    Appearance: He is well-developed. He is not diaphoretic.  HENT:     Head: Normocephalic and atraumatic.     Nose: Nose normal.  Eyes:     General: No scleral icterus.       Right eye: No discharge.        Left eye: No discharge.     Conjunctiva/sclera: Conjunctivae normal.     Pupils: Pupils are equal, round, and reactive to light.  Cardiovascular:     Rate and Rhythm: Normal rate and regular rhythm.     Heart sounds: No murmur heard.    No friction rub. No gallop.  Pulmonary:     Effort: Pulmonary effort is normal. No respiratory distress.     Breath sounds: Normal breath sounds. No stridor. No rales.  Chest:     Chest wall: Tenderness present.    Abdominal:     General: There is no distension.     Palpations: Abdomen is soft.     Tenderness: There is no abdominal tenderness.  Musculoskeletal:        General: No tenderness.     Cervical back: Normal range of motion and neck supple.  Skin:    General: Skin is warm and dry.     Findings: No erythema or rash.  Neurological:     Mental Status: He is alert and oriented to person, place, and time.     ED Results and Treatments Labs (all labs ordered are listed, but only abnormal results are  displayed) Labs Reviewed - No data to display                                                                                                                       EKG  EKG Interpretation  Date/Time:    Ventricular Rate:    PR Interval:    QRS Duration:   QT Interval:    QTC Calculation:   R Axis:     Text Interpretation:         Radiology No results found.  Medications Ordered in ED Medications - No data to display  Procedures Procedures  (including critical care time)  Medical Decision Making / ED Course  Click here for ABCD2, HEART and other calculators  Medical Decision Making Amount and/or Complexity of Data Reviewed Radiology: ordered.          Final Clinical Impression(s) / ED Diagnoses Final diagnoses:  None    {Document critical care time when appropriate:1}  {Document review of labs and clinical decision tools ie heart score, Chads2Vasc2 etc:1}  {Document your independent review of radiology images, and any outside records:1} {Document your discussion with family members, caretakers, and with consultants:1} {Document social determinants of health affecting pt's care:1} {Document your decision making why or why not admission, treatments were needed:1} This chart was dictated using voice recognition software.  Despite best efforts to proofread,  errors can occur which can change the documentation meaning.

## 2022-05-08 NOTE — ED Notes (Signed)
Reviewed AVS with patient, patient expressed understanding of directions, denies further questions at this time. 

## 2022-05-08 NOTE — Discharge Instructions (Addendum)
For pain control you may take 1000 mg of Tylenol every 8 hours scheduled.  In addition you can take 600mg  of Ibuprofen every 6 hours as needed for pain not controlled with the scheduled Tylenol.  Incentive spirometer: Use frequently to prevent pneumonia.  Recommended usage: 10 deep inhalations through spirometer every 2-3 hours for 7-10 days.

## 2022-05-08 NOTE — ED Notes (Signed)
RT educated pt on proper use of IS. Pt goal set at 1500 mLs pt able to obtain 2500 mLs without difficulty. Pt verbalizes understanding of teaching.

## 2022-05-18 IMAGING — CT CT CHEST W/O CM
2 of 4 series · 15 of 36 positions shown, 18 images · non-contrast
Comparison: None.

CLINICAL DATA: Chest trauma, blunt; fall down stairs



[Series 3: routine chest without · axial · non-contrast · 0.81mm/px · z∈[-598,-238]mm · 12 of 214 slices shown, 15 images]
[im 17/214  mediastinal]
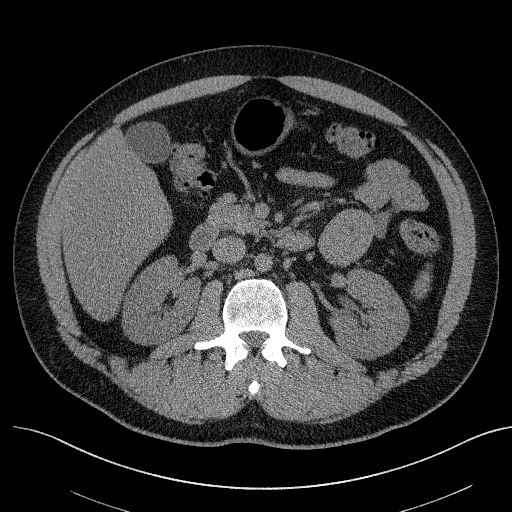
[im 17/214  lung]
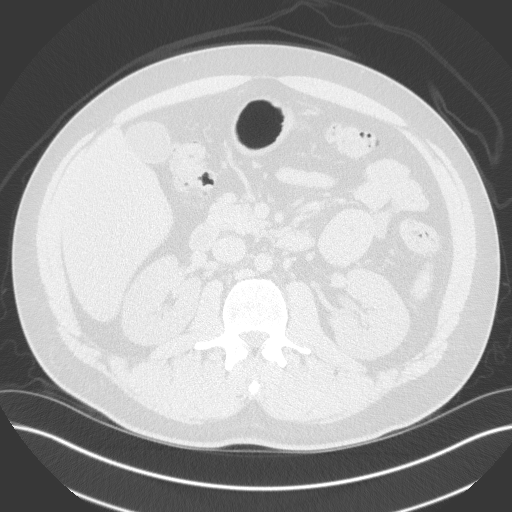
[im 33/214  lung]
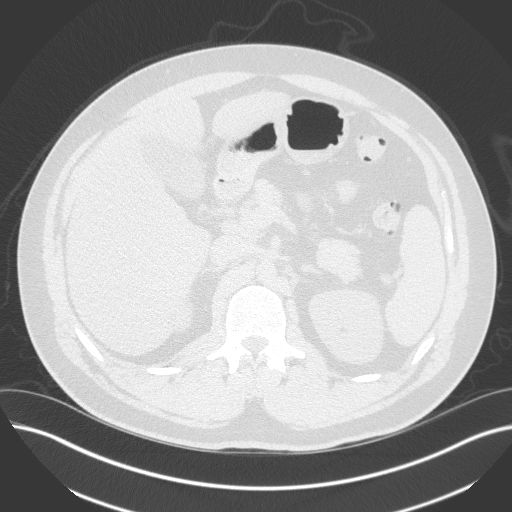
[im 50/214  lung]
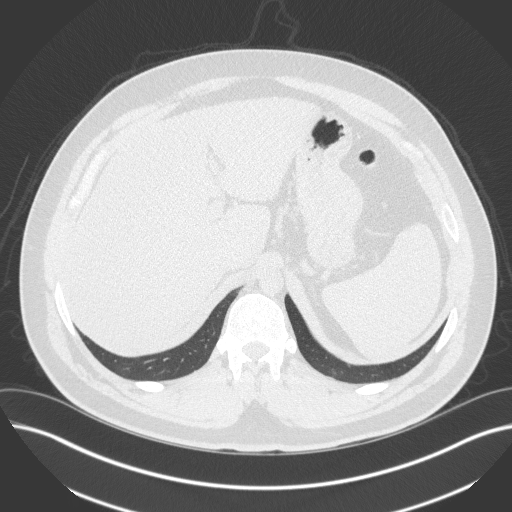
[im 66/214  lung]
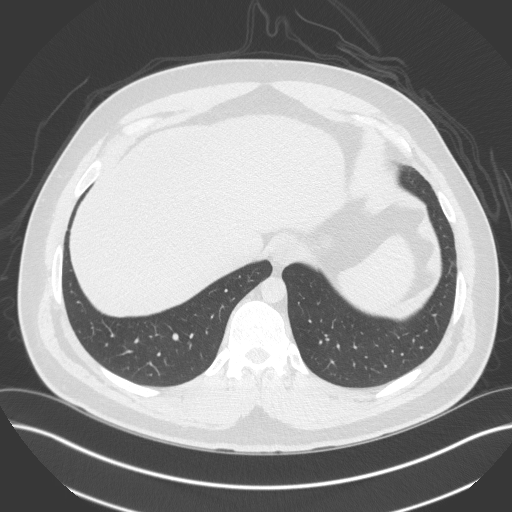
[im 82/214  mediastinal]
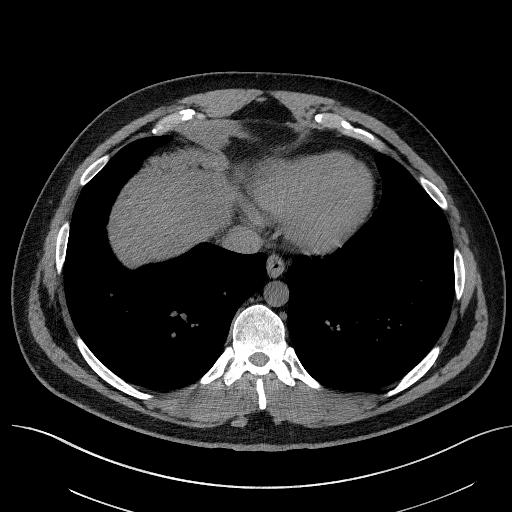
[im 82/214  lung]
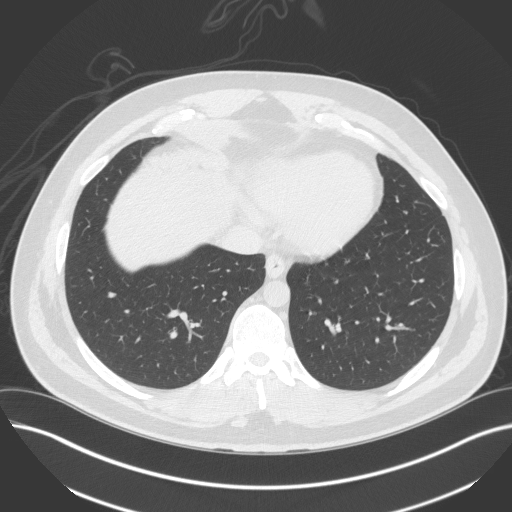
[im 99/214  lung]
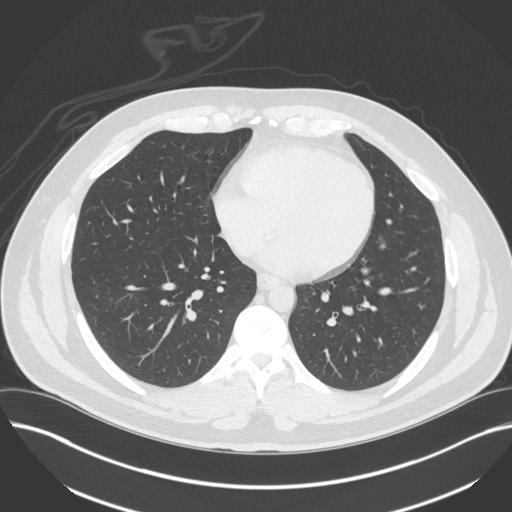
[im 115/214  lung]
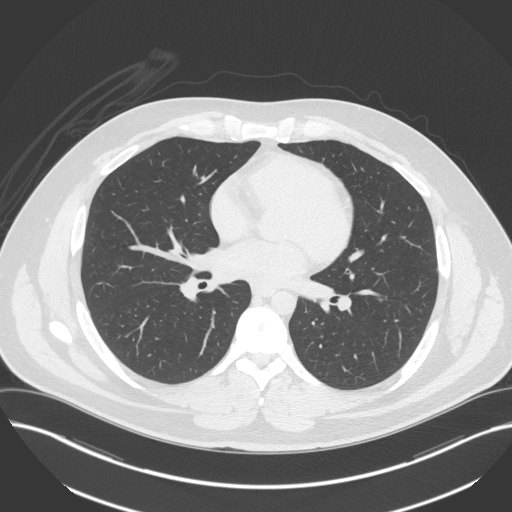
[im 132/214  lung]
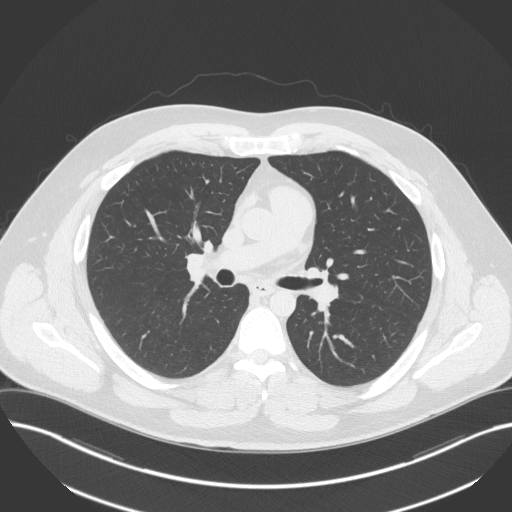
[im 148/214  mediastinal]
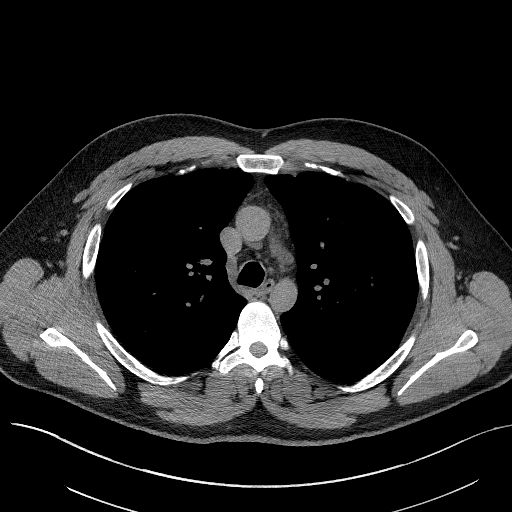
[im 148/214  lung]
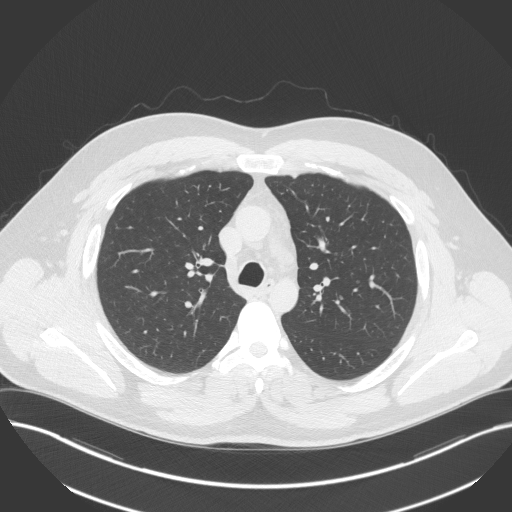
[im 164/214  lung]
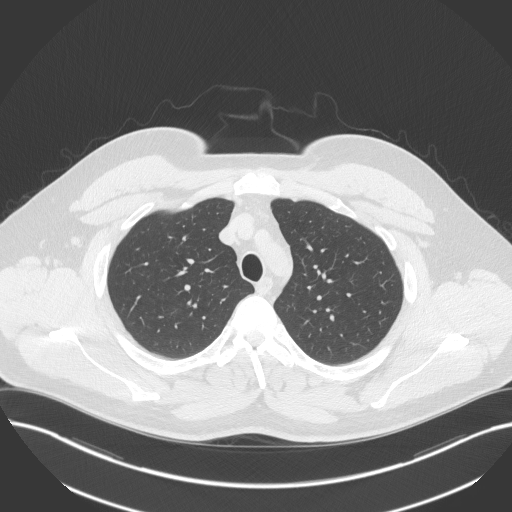
[im 181/214  lung]
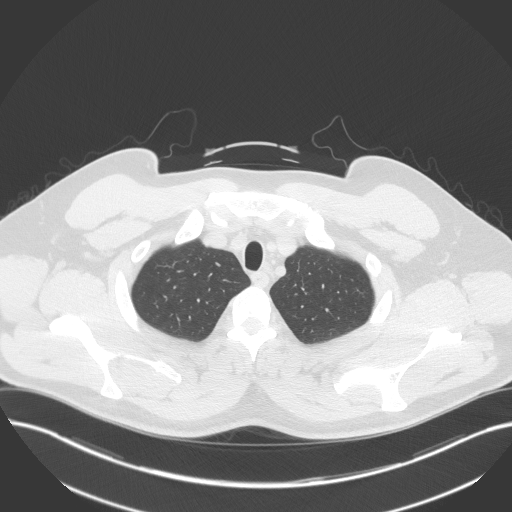
[im 197/214  lung]
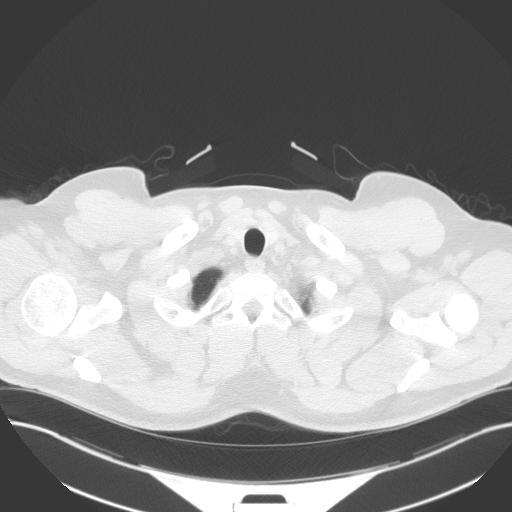

[Series 6: coronal · coronal · 0.83mm/px · 3 of 165 slices shown]
[im 33/165  lung]
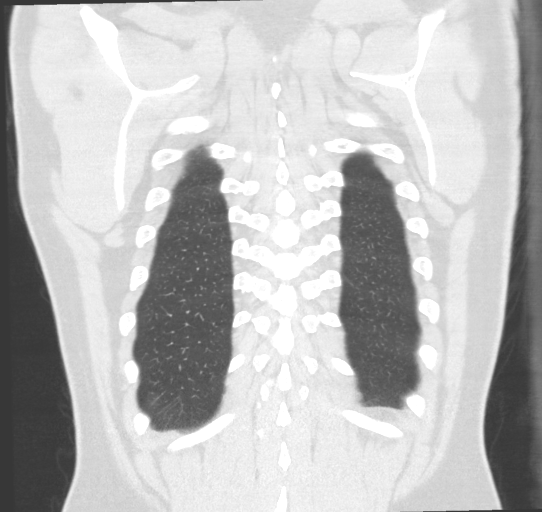
[im 66/165  lung]
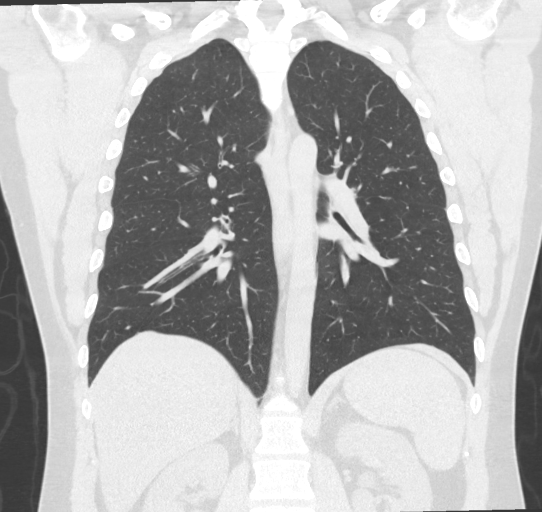
[im 99/165  lung]
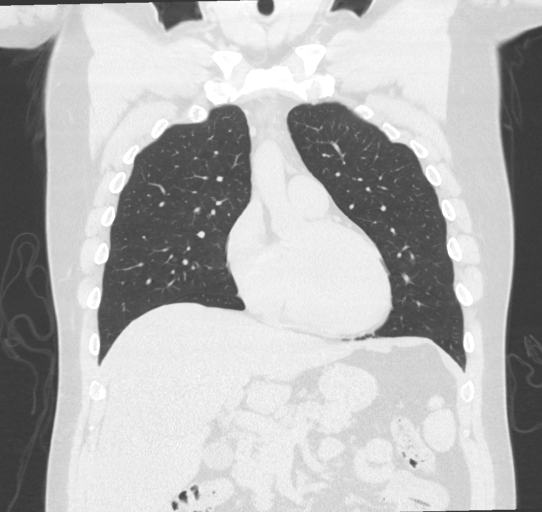

[15 of 36 positions shown; findings below may reference images not displayed]

FINDINGS: Cardiovascular: Normal heart size. No pericardial effusion. Thoracic
aorta is normal in caliber.

Mediastinum/Nodes: No mediastinal hematoma. Thyroid and esophagus
are unremarkable. No enlarged nodes.

Lungs/Pleura: No consolidation. No pleural effusion or pneumothorax.

Upper Abdomen: No acute abnormality.

Musculoskeletal: No acute fracture.
IMPRESSION: No evidence of acute traumatic injury.

## 2022-05-18 IMAGING — CT CT T SPINE W/O CM
3 of 4 series · 13 of 33 positions shown, 15 images · non-contrast
Comparison: CT cervical and thoracic spine 10/02/2017. CT head
08/24/2004.

CLINICAL DATA: Head trauma, moderate-severe; Neck trauma, dangerous
injury mechanism (Age 16-64y)

EXAM:
CT HEAD WITHOUT CONTRAST
CT CERVICAL SPINE WITHOUT CONTRAST
CT THORACIC  SPINE WITHOUT CONTRAST
TECHNIQUE: Multidetector CT imaging of the head, cervical and thoracic spine
was performed following the standard protocol without intravenous
contrast. Multiplanar CT image reconstructions of the cervical and
thoracic spine were also generated.
RADIATION DOSE REDUCTION: This exam was performed according to the
departmental dose-optimization program which includes automated
exposure control, adjustment of the mA and/or kV according to
patient size and/or use of iterative reconstruction technique.

[Series 1: t-spine axial · axial · 0.43mm/px · z∈[-560,-276]mm · 5 of 214 slices shown, 7 images]
[im 36/214  soft-tissue]
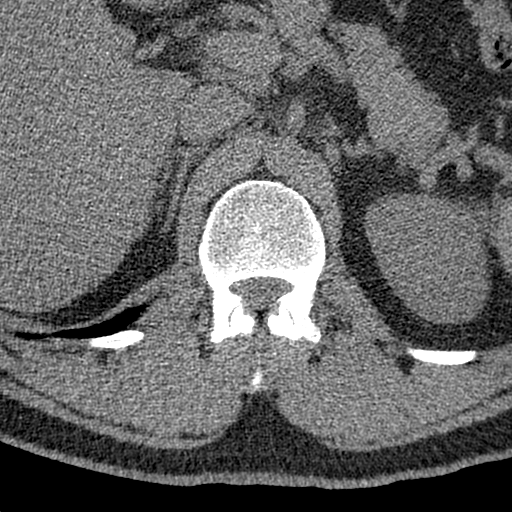
[im 36/214  bone]
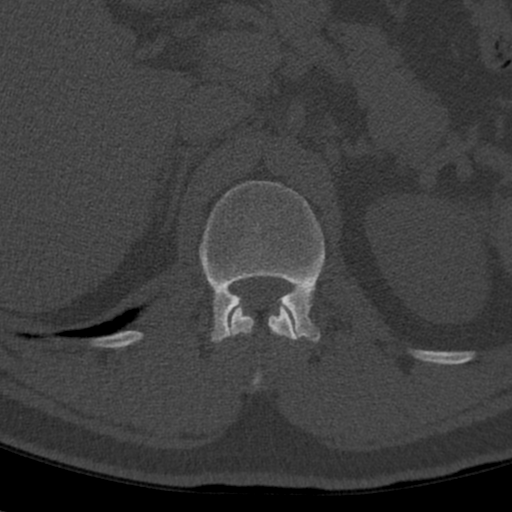
[im 72/214  bone]
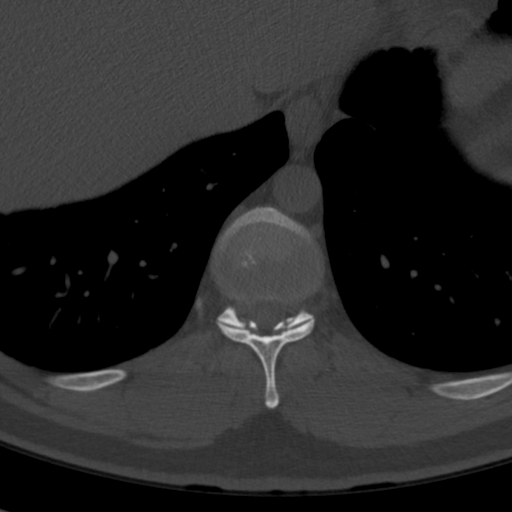
[im 107/214  bone]
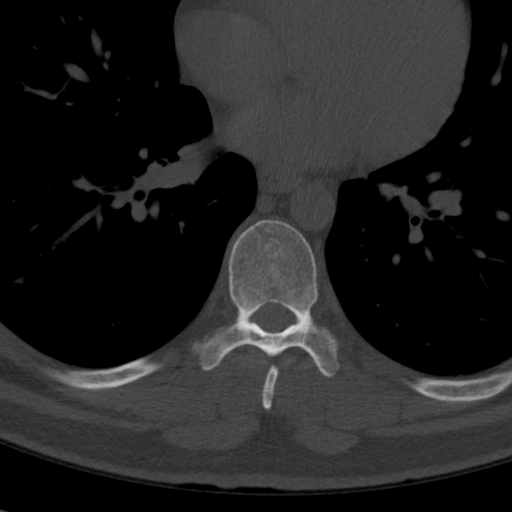
[im 143/214  bone]
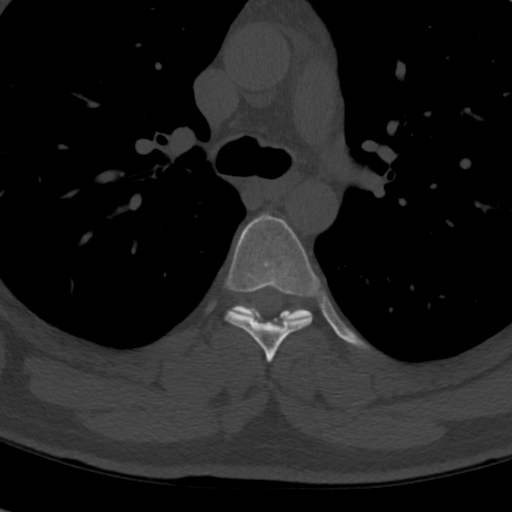
[im 178/214  soft-tissue]
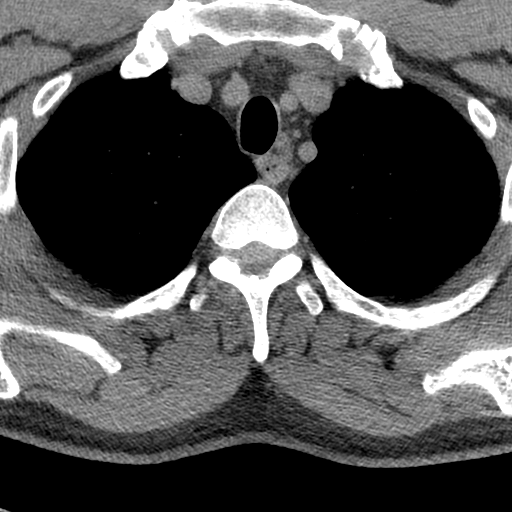
[im 178/214  bone]
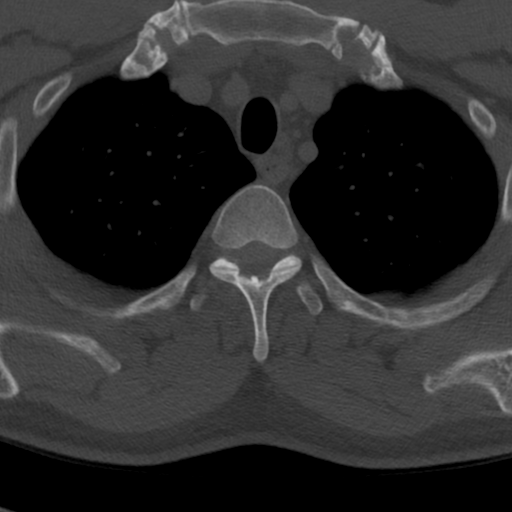

[Series 4: t-spine axial bone · axial · 0.43mm/px · z∈[-560,-276]mm · 5 of 214 slices shown]
[im 36/214  bone]
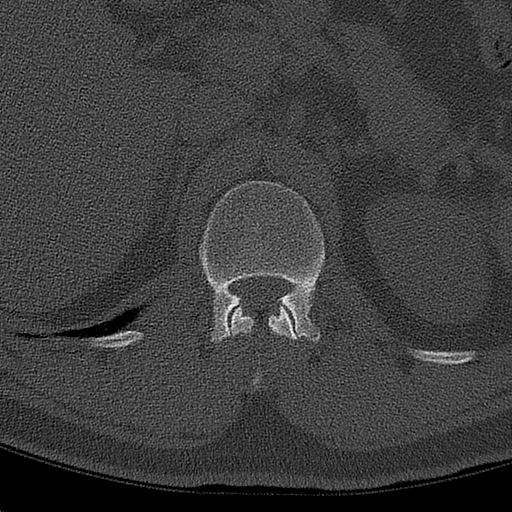
[im 72/214  bone]
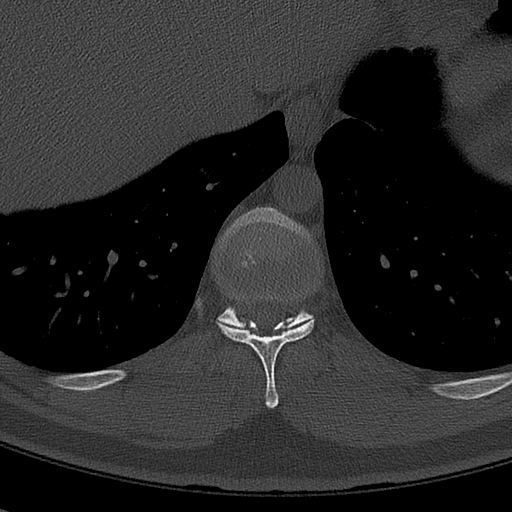
[im 107/214  bone]
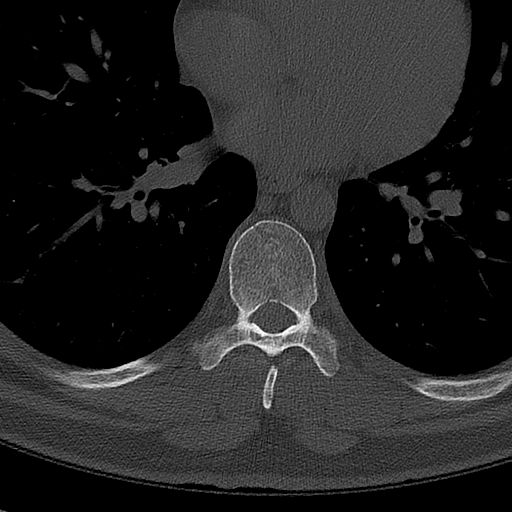
[im 143/214  bone]
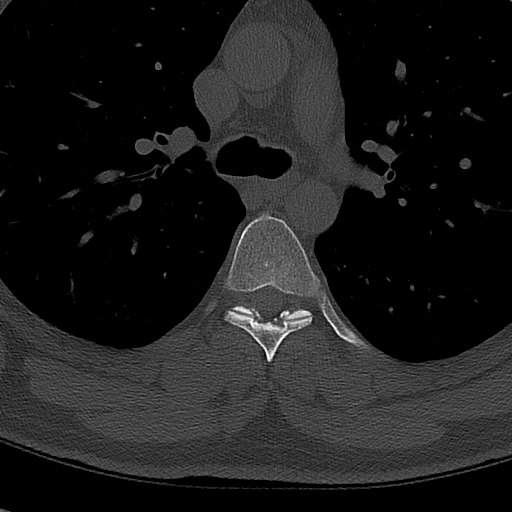
[im 178/214  bone]
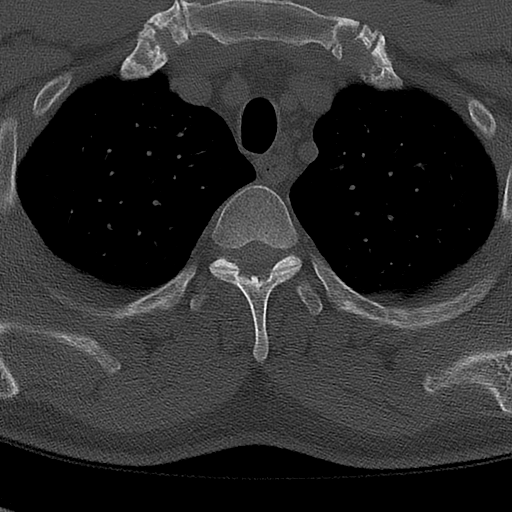

[Series 5: t-spine coronal bone · coronal · 0.40mm/px · 3 of 72 slices shown]
[im 15/72  bone]
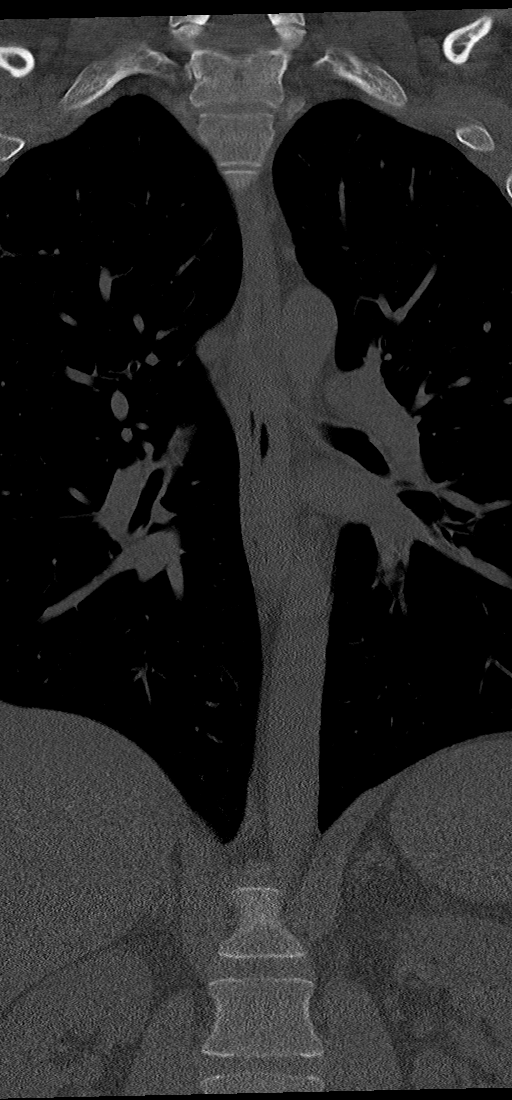
[im 29/72  bone]
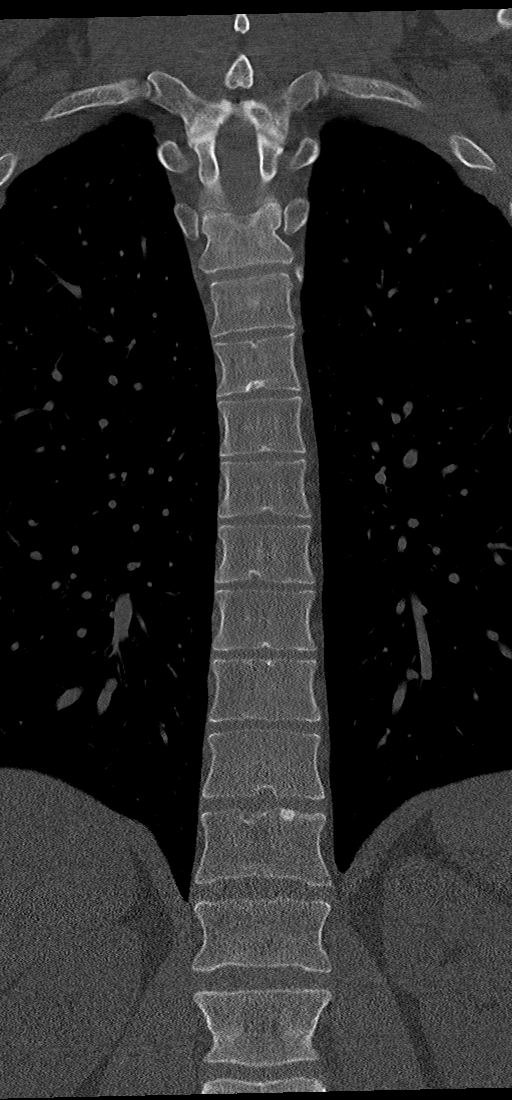
[im 43/72  bone]
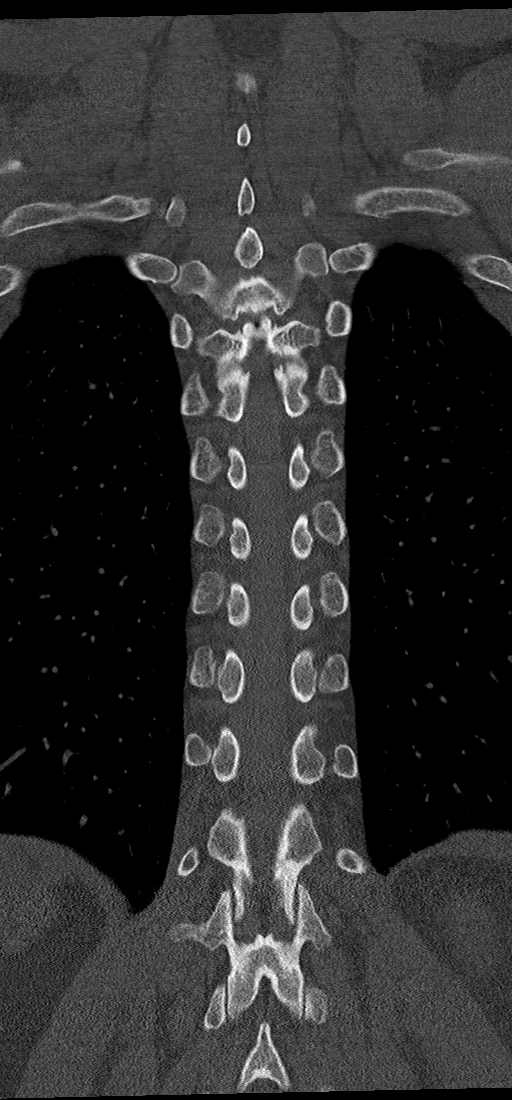

[13 of 33 positions shown; findings below may reference images not displayed]

FINDINGS: CT HEAD FINDINGS

Brain: No evidence of acute infarction, hemorrhage, hydrocephalus,
extra-axial collection or mass lesion/mass effect.

Vascular: No hyperdense vessel identified.

Skull: No acute fracture.

Sinuses/Orbits: Clear sinuses.

Other: No mastoid effusions.

CT CERVICAL SPINE FINDINGS

Alignment: Mild reversal of normal cervical lordosis, most likely
positional. No substantial sagittal subluxation. Craniocervical
alignment is within normal limits.

Skull base and vertebrae: Vertebral body heights are maintained. No
evidence of acute fracture.

Soft tissues and spinal canal: No prevertebral fluid or swelling. No
visible canal hematoma.

Disc levels:  Mild multilevel degenerative change.

Upper chest: Please see separately dictated CT chest for
intrathoracic evaluation.

CT THORACIC SPINE FINDINGS

Alignment: No substantial sagittal subluxation. Mild broad
levocurvature.

Skull base and vertebrae: Vertebral body heights are maintained. No
evidence of acute fracture.

Paraspinal soft tissues: Please see same day CT of the chest for
intrathoracic evaluation. No appreciable posterior paraspinal edema.

Disc levels: Multilevel degenerative disc disease with many endplate
Schmorl's nodes at multiple levels. No evidence of significant bony
stenosis.

Upper chest: Please see separately dictated CT chest for
intrathoracic evaluation.
IMPRESSION: 1. No evidence of acute intracranial abnormality.
2. No evidence of acute fracture or traumatic malalignment in the
cervical or thoracic spine.
3. Multilevel degenerative disc disease in the thoracic spine with
many endplate Schmorl's nodes.

## 2022-05-18 IMAGING — CT CT CERVICAL SPINE W/O CM
3 of 5 series · 11 of 33 positions shown, 13 images · non-contrast
Comparison: CT cervical and thoracic spine 10/02/2017. CT head
08/24/2004.

CLINICAL DATA: Head trauma, moderate-severe; Neck trauma, dangerous
injury mechanism (Age 16-64y)

EXAM:
CT HEAD WITHOUT CONTRAST
CT CERVICAL SPINE WITHOUT CONTRAST
CT THORACIC  SPINE WITHOUT CONTRAST
TECHNIQUE: Multidetector CT imaging of the head, cervical and thoracic spine
was performed following the standard protocol without intravenous
contrast. Multiplanar CT image reconstructions of the cervical and
thoracic spine were also generated.
RADIATION DOSE REDUCTION: This exam was performed according to the
departmental dose-optimization program which includes automated
exposure control, adjustment of the mA and/or kV according to
patient size and/or use of iterative reconstruction technique.

[Series 6: cor bone · coronal · 0.36mm/px · 3 of 57 slices shown]
[im 12/57  bone]
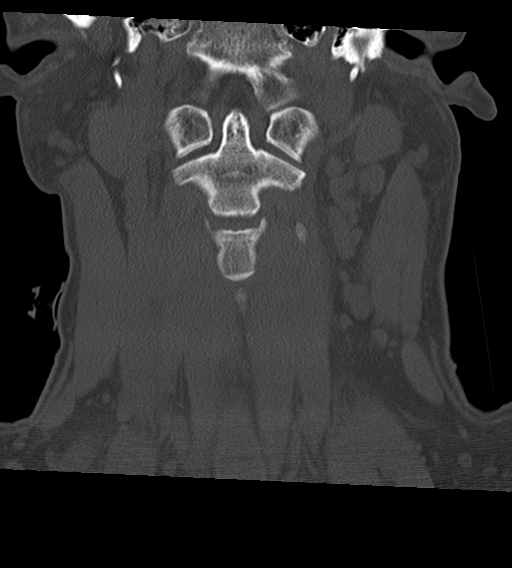
[im 23/57  bone]
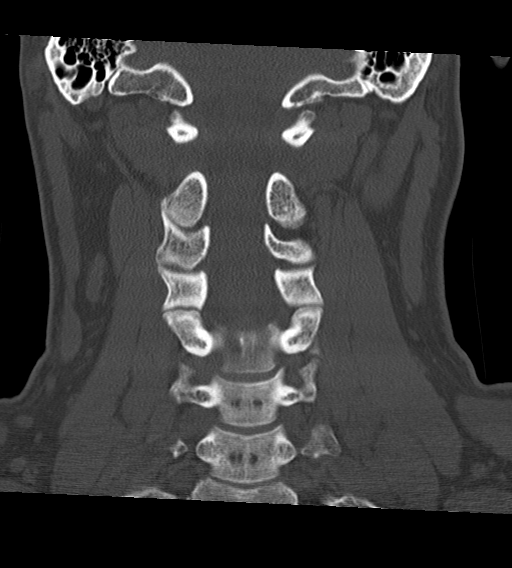
[im 34/57  bone]
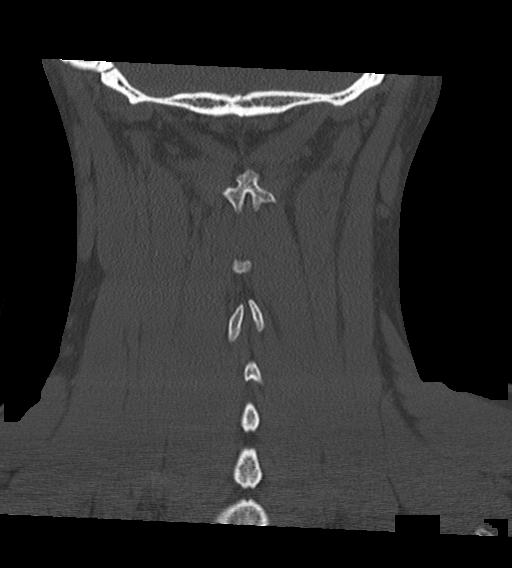

[Series 7: sag bone · sagittal · 0.22mm/px · 5 of 59 slices shown, 6 images]
[im 20/59  bone]
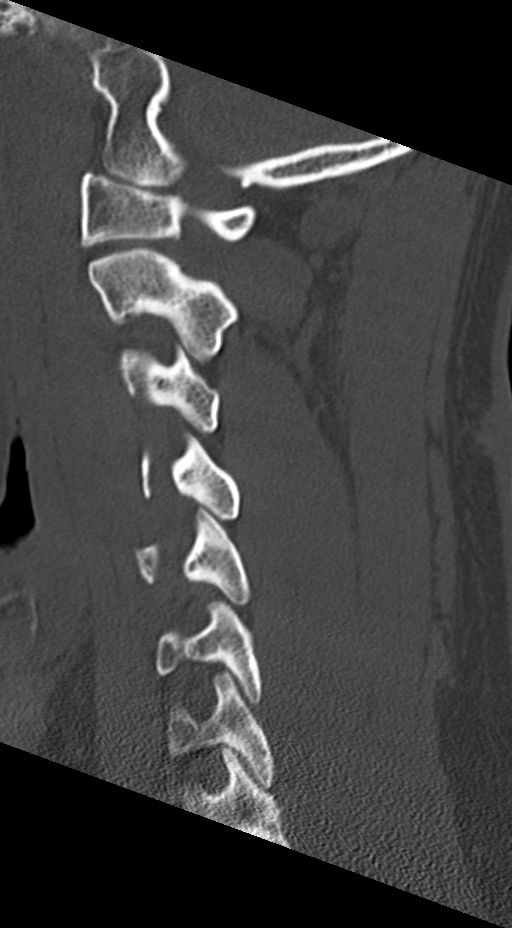
[im 25/59  bone]
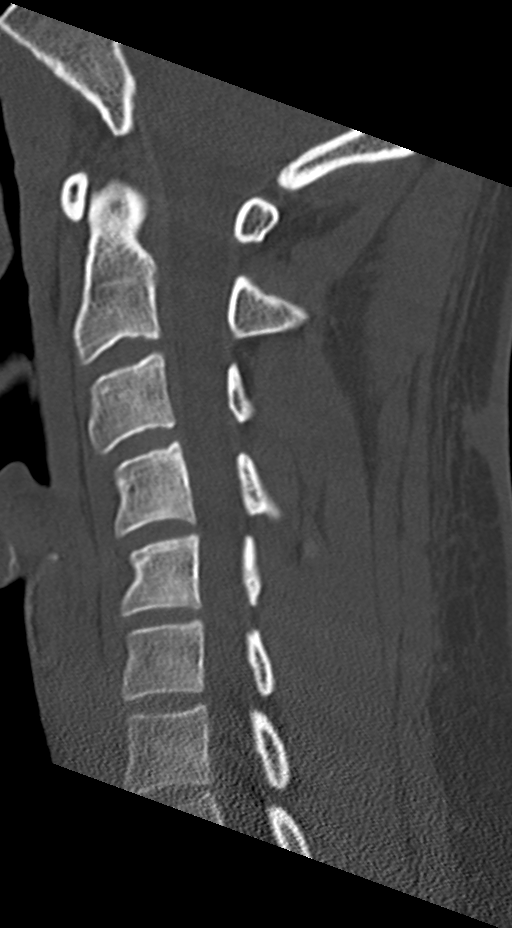
[im 30/59  soft-tissue]
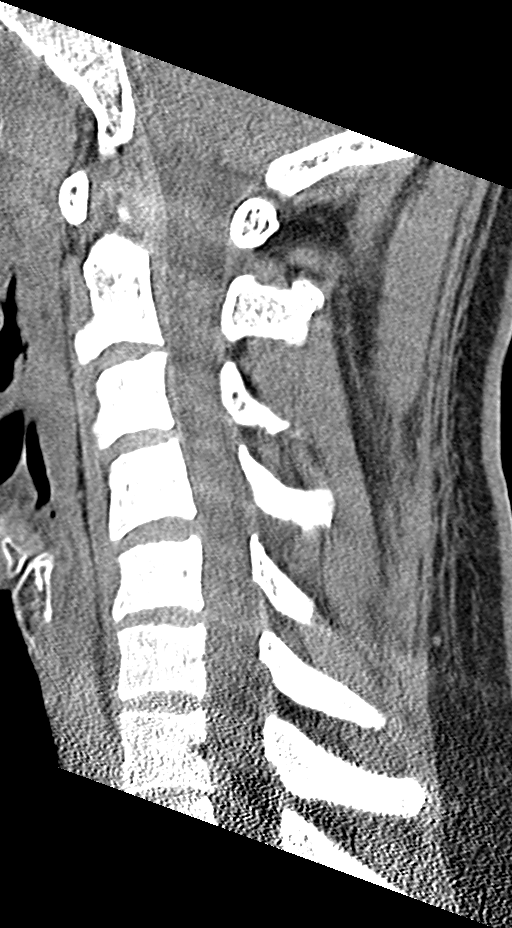
[im 30/59  bone]
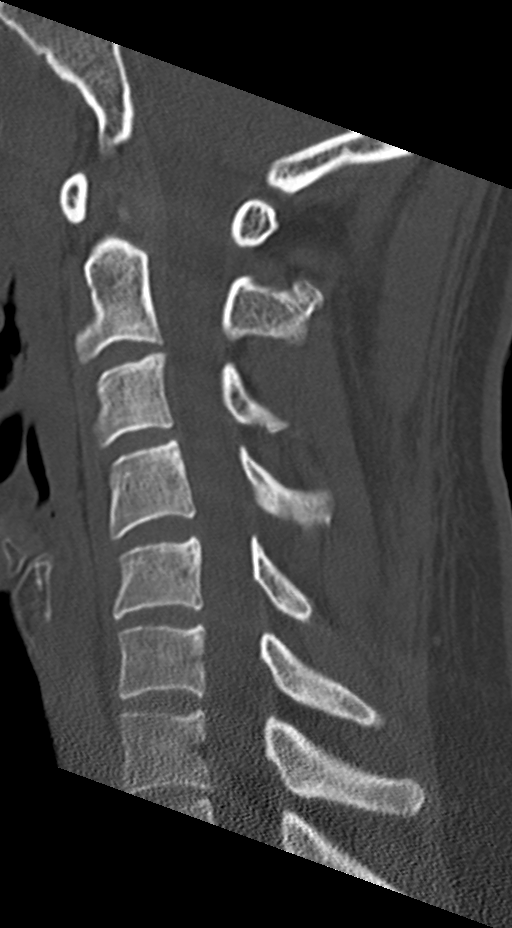
[im 34/59  bone]
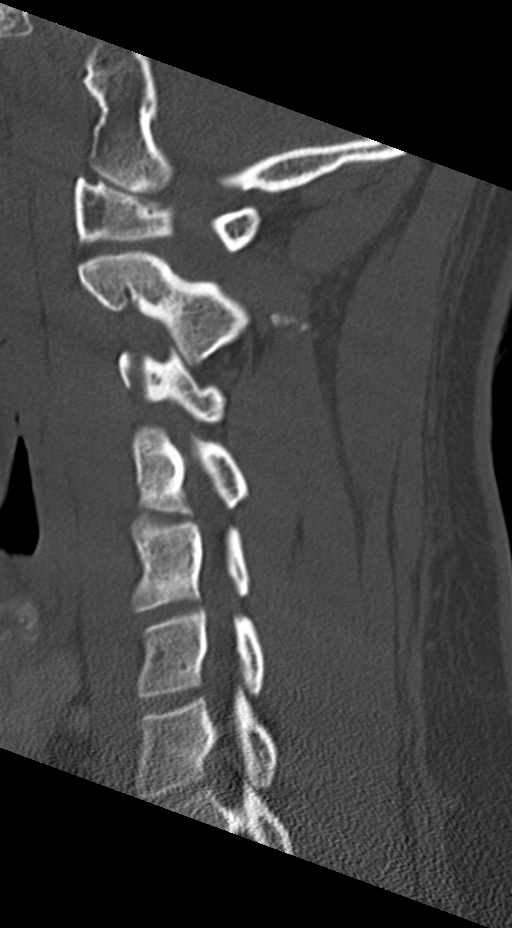
[im 39/59  bone]
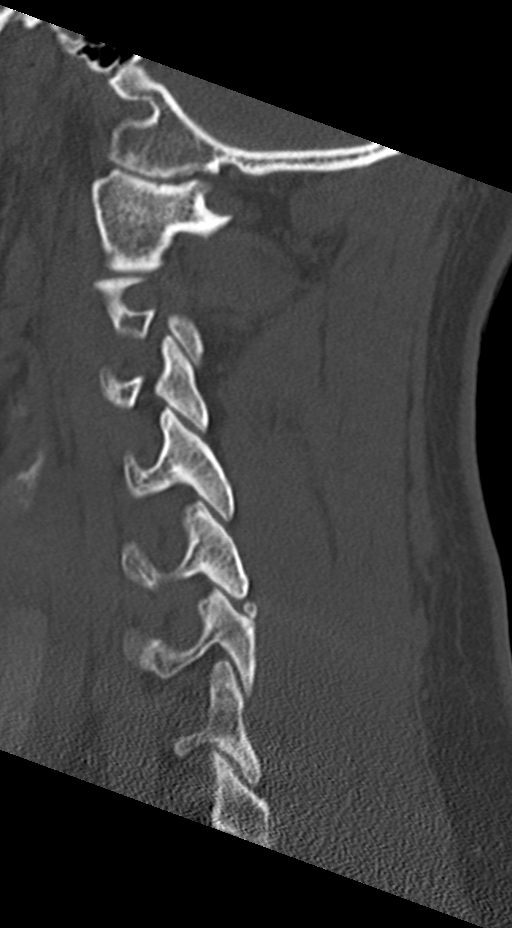

[Series 8: orthogonal axials · axial · 0.23mm/px · z∈[-202,-142]mm · 3 of 78 slices shown, 4 images]
[im 20/78  soft-tissue]
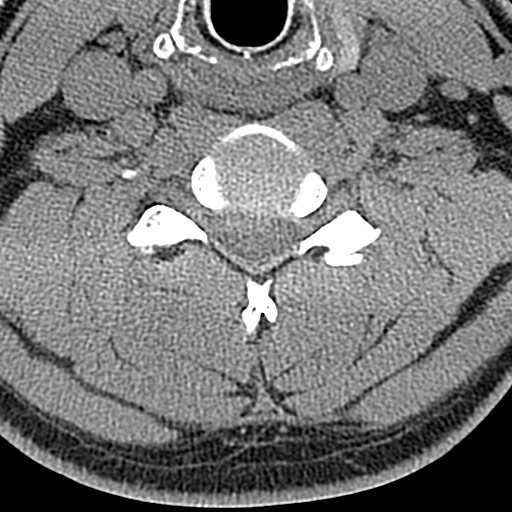
[im 20/78  bone]
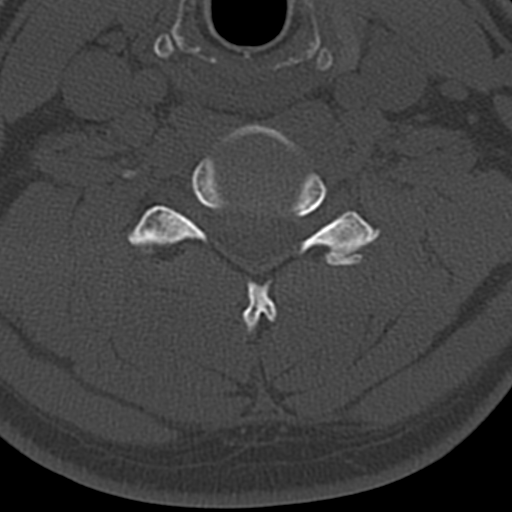
[im 39/78  bone]
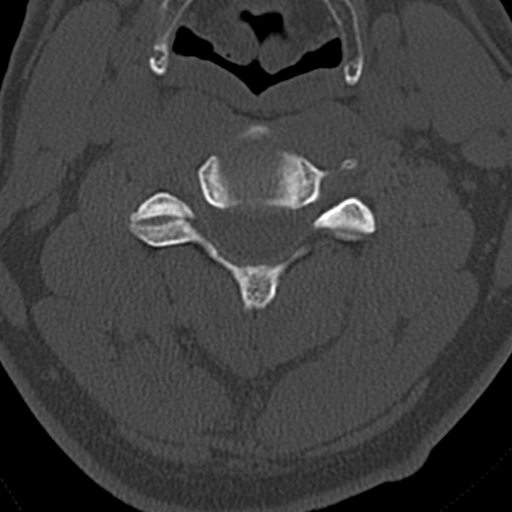
[im 58/78  bone]
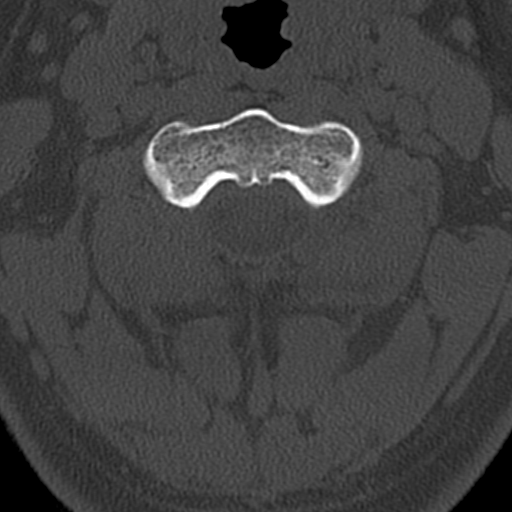

[11 of 33 positions shown; findings below may reference images not displayed]

FINDINGS: CT HEAD FINDINGS

Brain: No evidence of acute infarction, hemorrhage, hydrocephalus,
extra-axial collection or mass lesion/mass effect.

Vascular: No hyperdense vessel identified.

Skull: No acute fracture.

Sinuses/Orbits: Clear sinuses.

Other: No mastoid effusions.

CT CERVICAL SPINE FINDINGS

Alignment: Mild reversal of normal cervical lordosis, most likely
positional. No substantial sagittal subluxation. Craniocervical
alignment is within normal limits.

Skull base and vertebrae: Vertebral body heights are maintained. No
evidence of acute fracture.

Soft tissues and spinal canal: No prevertebral fluid or swelling. No
visible canal hematoma.

Disc levels:  Mild multilevel degenerative change.

Upper chest: Please see separately dictated CT chest for
intrathoracic evaluation.

CT THORACIC SPINE FINDINGS

Alignment: No substantial sagittal subluxation. Mild broad
levocurvature.

Skull base and vertebrae: Vertebral body heights are maintained. No
evidence of acute fracture.

Paraspinal soft tissues: Please see same day CT of the chest for
intrathoracic evaluation. No appreciable posterior paraspinal edema.

Disc levels: Multilevel degenerative disc disease with many endplate
Schmorl's nodes at multiple levels. No evidence of significant bony
stenosis.

Upper chest: Please see separately dictated CT chest for
intrathoracic evaluation.
IMPRESSION: 1. No evidence of acute intracranial abnormality.
2. No evidence of acute fracture or traumatic malalignment in the
cervical or thoracic spine.
3. Multilevel degenerative disc disease in the thoracic spine with
many endplate Schmorl's nodes.

## 2022-07-13 ENCOUNTER — Inpatient Hospital Stay (HOSPITAL_COMMUNITY): Payer: Medicaid Other

## 2022-07-13 ENCOUNTER — Emergency Department (HOSPITAL_COMMUNITY): Payer: Medicaid Other

## 2022-07-13 ENCOUNTER — Observation Stay (HOSPITAL_COMMUNITY)
Admission: EM | Admit: 2022-07-13 | Discharge: 2022-07-13 | Discharge status: Against Medical Advice | Payer: Medicaid Other | Attending: Internal Medicine | Admitting: Internal Medicine

## 2022-07-13 DIAGNOSIS — M542 Cervicalgia: Secondary | ICD-10-CM | POA: Diagnosis not present

## 2022-07-13 DIAGNOSIS — J45909 Unspecified asthma, uncomplicated: Secondary | ICD-10-CM | POA: Diagnosis present

## 2022-07-13 DIAGNOSIS — R0602 Shortness of breath: Secondary | ICD-10-CM | POA: Diagnosis not present

## 2022-07-13 DIAGNOSIS — W57XXXA Bitten or stung by nonvenomous insect and other nonvenomous arthropods, initial encounter: Secondary | ICD-10-CM | POA: Diagnosis not present

## 2022-07-13 DIAGNOSIS — Z888 Allergy status to other drugs, medicaments and biological substances status: Secondary | ICD-10-CM

## 2022-07-13 DIAGNOSIS — Z6832 Body mass index (BMI) 32.0-32.9, adult: Secondary | ICD-10-CM | POA: Diagnosis not present

## 2022-07-13 DIAGNOSIS — Z79899 Other long term (current) drug therapy: Secondary | ICD-10-CM | POA: Diagnosis not present

## 2022-07-13 DIAGNOSIS — E039 Hypothyroidism, unspecified: Secondary | ICD-10-CM | POA: Insufficient documentation

## 2022-07-13 DIAGNOSIS — I10 Essential (primary) hypertension: Secondary | ICD-10-CM | POA: Insufficient documentation

## 2022-07-13 DIAGNOSIS — G9340 Encephalopathy, unspecified: Secondary | ICD-10-CM

## 2022-07-13 DIAGNOSIS — R4182 Altered mental status, unspecified: Secondary | ICD-10-CM | POA: Diagnosis present

## 2022-07-13 DIAGNOSIS — R092 Respiratory arrest: Secondary | ICD-10-CM | POA: Diagnosis not present

## 2022-07-13 DIAGNOSIS — Z885 Allergy status to narcotic agent status: Secondary | ICD-10-CM

## 2022-07-13 DIAGNOSIS — G928 Other toxic encephalopathy: Secondary | ICD-10-CM | POA: Diagnosis not present

## 2022-07-13 DIAGNOSIS — Z981 Arthrodesis status: Secondary | ICD-10-CM | POA: Diagnosis not present

## 2022-07-13 DIAGNOSIS — K219 Gastro-esophageal reflux disease without esophagitis: Secondary | ICD-10-CM | POA: Insufficient documentation

## 2022-07-13 DIAGNOSIS — H5704 Mydriasis: Secondary | ICD-10-CM | POA: Diagnosis present

## 2022-07-13 DIAGNOSIS — Z5329 Procedure and treatment not carried out because of patient's decision for other reasons: Secondary | ICD-10-CM | POA: Diagnosis not present

## 2022-07-13 DIAGNOSIS — Z79890 Hormone replacement therapy (postmenopausal): Secondary | ICD-10-CM

## 2022-07-13 DIAGNOSIS — Y907 Blood alcohol level of 200-239 mg/100 ml: Secondary | ICD-10-CM | POA: Diagnosis present

## 2022-07-13 DIAGNOSIS — R0689 Other abnormalities of breathing: Secondary | ICD-10-CM | POA: Diagnosis present

## 2022-07-13 DIAGNOSIS — E669 Obesity, unspecified: Secondary | ICD-10-CM | POA: Insufficient documentation

## 2022-07-13 DIAGNOSIS — I4510 Unspecified right bundle-branch block: Secondary | ICD-10-CM | POA: Diagnosis present

## 2022-07-13 DIAGNOSIS — R569 Unspecified convulsions: Secondary | ICD-10-CM | POA: Diagnosis not present

## 2022-07-13 DIAGNOSIS — M549 Dorsalgia, unspecified: Secondary | ICD-10-CM | POA: Diagnosis not present

## 2022-07-13 LAB — CBC WITH DIFFERENTIAL/PLATELET
Abs Immature Granulocytes: 0.04 10*3/uL (ref 0.00–0.07)
Basophils Absolute: 0.1 10*3/uL (ref 0.0–0.1)
Basophils Relative: 1 %
Eosinophils Absolute: 0.2 10*3/uL (ref 0.0–0.5)
Eosinophils Relative: 2 %
HCT: 40.1 % (ref 39.0–52.0)
Hemoglobin: 14.2 g/dL (ref 13.0–17.0)
Immature Granulocytes: 1 %
Lymphocytes Relative: 56 %
Lymphs Abs: 4.6 10*3/uL — ABNORMAL HIGH (ref 0.7–4.0)
MCH: 34.1 pg — ABNORMAL HIGH (ref 26.0–34.0)
MCHC: 35.4 g/dL (ref 30.0–36.0)
MCV: 96.4 fL (ref 80.0–100.0)
Monocytes Absolute: 0.6 10*3/uL (ref 0.1–1.0)
Monocytes Relative: 7 %
Neutro Abs: 2.7 10*3/uL (ref 1.7–7.7)
Neutrophils Relative %: 33 %
Platelets: 204 10*3/uL (ref 150–400)
RBC: 4.16 MIL/uL — ABNORMAL LOW (ref 4.22–5.81)
RDW: 11.4 % — ABNORMAL LOW (ref 11.5–15.5)
WBC: 8.2 10*3/uL (ref 4.0–10.5)
nRBC: 0 % (ref 0.0–0.2)

## 2022-07-13 LAB — RAPID URINE DRUG SCREEN, HOSP PERFORMED
Amphetamines: NOT DETECTED
Barbiturates: NOT DETECTED
Benzodiazepines: NOT DETECTED
Cocaine: NOT DETECTED
Opiates: NOT DETECTED
Tetrahydrocannabinol: NOT DETECTED

## 2022-07-13 LAB — COMPREHENSIVE METABOLIC PANEL
ALT: 51 U/L — ABNORMAL HIGH (ref 0–44)
AST: 41 U/L (ref 15–41)
Albumin: 3.7 g/dL (ref 3.5–5.0)
Alkaline Phosphatase: 88 U/L (ref 38–126)
Anion gap: 14 (ref 5–15)
BUN: 13 mg/dL (ref 6–20)
CO2: 21 mmol/L — ABNORMAL LOW (ref 22–32)
Calcium: 8.1 mg/dL — ABNORMAL LOW (ref 8.9–10.3)
Chloride: 104 mmol/L (ref 98–111)
Creatinine, Ser: 0.95 mg/dL (ref 0.61–1.24)
GFR, Estimated: >60 mL/min (ref >60)
Glucose, Bld: 135 mg/dL — ABNORMAL HIGH (ref 70–99)
Potassium: 3.7 mmol/L (ref 3.5–5.1)
Sodium: 139 mmol/L (ref 135–145)
Total Bilirubin: 0.3 mg/dL (ref 0.3–1.2)
Total Protein: 6.3 g/dL — ABNORMAL LOW (ref 6.5–8.1)

## 2022-07-13 LAB — I-STAT ARTERIAL BLOOD GAS, ED
Acid-base deficit: 4 mmol/L — ABNORMAL HIGH (ref 0.0–2.0)
Bicarbonate: 22.4 mmol/L (ref 20.0–28.0)
Calcium, Ion: 1.15 mmol/L (ref 1.15–1.40)
HCT: 41 % (ref 39.0–52.0)
Hemoglobin: 13.9 g/dL (ref 13.0–17.0)
O2 Saturation: 100 %
Patient temperature: 96.5
Potassium: 3.5 mmol/L (ref 3.5–5.1)
Sodium: 141 mmol/L (ref 135–145)
TCO2: 24 mmol/L (ref 22–32)
pCO2 arterial: 42.5 mmHg (ref 32–48)
pH, Arterial: 7.324 — ABNORMAL LOW (ref 7.35–7.45)
pO2, Arterial: 489 mmHg — ABNORMAL HIGH (ref 83–108)

## 2022-07-13 LAB — URINALYSIS, ROUTINE W REFLEX MICROSCOPIC
Bilirubin Urine: NEGATIVE
Glucose, UA: NEGATIVE mg/dL
Hgb urine dipstick: NEGATIVE
Ketones, ur: NEGATIVE mg/dL
Leukocytes,Ua: NEGATIVE
Nitrite: NEGATIVE
Protein, ur: NEGATIVE mg/dL
Specific Gravity, Urine: 1.009 (ref 1.005–1.030)
pH: 6 (ref 5.0–8.0)

## 2022-07-13 LAB — MRSA NEXT GEN BY PCR, NASAL: MRSA by PCR Next Gen: NOT DETECTED

## 2022-07-13 LAB — ACETAMINOPHEN LEVEL
Acetaminophen (Tylenol), Serum: 12 ug/mL (ref 10–30)
Acetaminophen (Tylenol), Serum: <10 ug/mL — ABNORMAL LOW (ref 10–30)

## 2022-07-13 LAB — TSH: TSH: 4.445 u[IU]/mL (ref 0.350–4.500)

## 2022-07-13 LAB — SALICYLATE LEVEL: Salicylate Lvl: <7 mg/dL — ABNORMAL LOW (ref 7.0–30.0)

## 2022-07-13 LAB — MAGNESIUM: Magnesium: 2.4 mg/dL (ref 1.7–2.4)

## 2022-07-13 LAB — GLUCOSE, CAPILLARY
Glucose-Capillary: 136 mg/dL — ABNORMAL HIGH (ref 70–99)
Glucose-Capillary: 96 mg/dL (ref 70–99)
Glucose-Capillary: 96 mg/dL (ref 70–99)
Glucose-Capillary: 84 mg/dL (ref 70–99)

## 2022-07-13 LAB — ETHANOL: Alcohol, Ethyl (B): 228 mg/dL — ABNORMAL HIGH (ref <10)

## 2022-07-13 LAB — PROCALCITONIN: Procalcitonin: <0.1 ng/mL

## 2022-07-13 LAB — CBG MONITORING, ED: Glucose-Capillary: 124 mg/dL — ABNORMAL HIGH (ref 70–99)

## 2022-07-13 MED ORDER — LEVETIRACETAM 500 MG PO TABS
500.0000 mg | ORAL_TABLET | Freq: Two times a day (BID) | ORAL | Status: AC
  Administered 2022-07-13: 500 mg via ORAL
  Filled 2022-07-13: qty 1

## 2022-07-13 MED ORDER — ALBUTEROL SULFATE (2.5 MG/3ML) 0.083% IN NEBU: 2.5000 mg | INHALATION_SOLUTION | RESPIRATORY_TRACT | Status: DC | PRN

## 2022-07-13 MED ORDER — SODIUM CHLORIDE 0.9 % IV SOLN: 3000.0000 mg | Freq: Once | INTRAVENOUS | Status: DC

## 2022-07-13 MED ORDER — ENOXAPARIN SODIUM 60 MG/0.6ML IJ SOSY
55.0000 mg | PREFILLED_SYRINGE | Freq: Every day | INTRAMUSCULAR | Status: DC
  Administered 2022-07-13: 55 mg via SUBCUTANEOUS
  Filled 2022-07-13 (×2): qty 0.6

## 2022-07-13 MED ORDER — INSULIN ASPART 100 UNIT/ML IJ SOLN
0.0000 [IU] | INTRAMUSCULAR | Status: DC
  Administered 2022-07-13: 1 [IU] via SUBCUTANEOUS

## 2022-07-13 MED ORDER — ORAL CARE MOUTH RINSE: 15.0000 mL | OROMUCOSAL | Status: DC | PRN

## 2022-07-13 MED ORDER — FOLIC ACID 1 MG PO TABS
1.0000 mg | ORAL_TABLET | Freq: Every day | ORAL | Status: DC
  Administered 2022-07-13: 1 mg
  Filled 2022-07-13: qty 1

## 2022-07-13 MED ORDER — LEVOTHYROXINE SODIUM 25 MCG PO TABS
25.0000 ug | ORAL_TABLET | Freq: Every day | ORAL | Status: DC
  Administered 2022-07-13: 25 ug
  Filled 2022-07-13: qty 1

## 2022-07-13 MED ORDER — THIAMINE HCL 100 MG/ML IJ SOLN
100.0000 mg | Freq: Every day | INTRAMUSCULAR | Status: DC
  Administered 2022-07-13: 100 mg via INTRAVENOUS
  Filled 2022-07-13: qty 2

## 2022-07-13 MED ORDER — ORAL CARE MOUTH RINSE
15.0000 mL | OROMUCOSAL | Status: DC
  Administered 2022-07-13 (×2): 15 mL via OROMUCOSAL

## 2022-07-13 MED ORDER — FENTANYL CITRATE PF 50 MCG/ML IJ SOSY: 50.0000 ug | PREFILLED_SYRINGE | Freq: Once | INTRAMUSCULAR | Status: DC

## 2022-07-13 MED ORDER — FENTANYL 2500MCG IN NS 250ML (10MCG/ML) PREMIX INFUSION
0.0000 ug/h | INTRAVENOUS | Status: DC
  Administered 2022-07-13: 50 ug/h via INTRAVENOUS
  Administered 2022-07-13: 25 ug/h via INTRAVENOUS

## 2022-07-13 MED ORDER — FENTANYL BOLUS VIA INFUSION: 50.0000 ug | INTRAVENOUS | Status: DC | PRN

## 2022-07-13 MED ORDER — ADULT MULTIVITAMIN W/MINERALS CH
1.0000 | ORAL_TABLET | Freq: Every day | ORAL | Status: DC
  Administered 2022-07-13: 1
  Filled 2022-07-13: qty 1

## 2022-07-13 MED ORDER — LACTATED RINGERS IV SOLN: INTRAVENOUS | Status: DC

## 2022-07-13 MED ORDER — POLYETHYLENE GLYCOL 3350 17 G PO PACK
17.0000 g | PACK | Freq: Every day | ORAL | Status: DC
  Administered 2022-07-13: 17 g
  Filled 2022-07-13: qty 1

## 2022-07-13 MED ORDER — LEVETIRACETAM 500 MG PO TABS: 500.0000 mg | ORAL_TABLET | Freq: Two times a day (BID) | ORAL | 2 refills | Status: AC

## 2022-07-13 MED ORDER — PROPOFOL 1000 MG/100ML IV EMUL
5.0000 ug/kg/min | INTRAVENOUS | Status: DC
  Administered 2022-07-13: 25 ug/kg/min via INTRAVENOUS
  Filled 2022-07-13: qty 100

## 2022-07-13 MED ORDER — PROPOFOL 1000 MG/100ML IV EMUL
5.0000 ug/kg/min | INTRAVENOUS | Status: DC
  Administered 2022-07-13: 5 ug/kg/min via INTRAVENOUS
  Administered 2022-07-13: 50 ug/kg/min via INTRAVENOUS

## 2022-07-13 MED ORDER — LORAZEPAM 2 MG/ML IJ SOLN
1.0000 mg | Freq: Once | INTRAMUSCULAR | Status: AC
  Administered 2022-07-13: 1 mg via INTRAVENOUS
  Filled 2022-07-13: qty 1

## 2022-07-13 MED ORDER — LEVETIRACETAM IN NACL 1000 MG/100ML IV SOLN: 1000.0000 mg | Freq: Once | INTRAVENOUS | Status: AC

## 2022-07-13 MED ORDER — VECURONIUM BROMIDE 10 MG IV SOLR
10.0000 mg | Freq: Once | INTRAVENOUS | Status: DC
  Filled 2022-07-13: qty 10

## 2022-07-13 MED ORDER — FENTANYL 2500MCG IN NS 250ML (10MCG/ML) PREMIX INFUSION
50.0000 ug/h | INTRAVENOUS | Status: DC
  Administered 2022-07-13: 100 ug/h via INTRAVENOUS
  Administered 2022-07-13: 50 ug/h via INTRAVENOUS

## 2022-07-13 MED ORDER — DOCUSATE SODIUM 50 MG/5ML PO LIQD
100.0000 mg | Freq: Two times a day (BID) | ORAL | Status: DC
  Administered 2022-07-13: 100 mg
  Filled 2022-07-13: qty 10

## 2022-07-13 MED ORDER — LEVETIRACETAM IN NACL 1000 MG/100ML IV SOLN
1000.0000 mg | Freq: Once | INTRAVENOUS | Status: AC
  Administered 2022-07-13: 1000 mg via INTRAVENOUS

## 2022-07-13 MED ORDER — FENTANYL 2500MCG IN NS 250ML (10MCG/ML) PREMIX INFUSION
INTRAVENOUS | Status: AC
  Administered 2022-07-13: 50 ug/h via INTRAVENOUS
  Filled 2022-07-13: qty 250

## 2022-07-13 MED ORDER — PROPOFOL 1000 MG/100ML IV EMUL
INTRAVENOUS | Status: AC
  Filled 2022-07-13: qty 100

## 2022-07-13 MED ORDER — LEVETIRACETAM IN NACL 1000 MG/100ML IV SOLN
1000.0000 mg | Freq: Once | INTRAVENOUS | Status: AC
  Administered 2022-07-13: 1000 mg via INTRAVENOUS
  Filled 2022-07-13: qty 100

## 2022-07-13 MED ORDER — LEVETIRACETAM IN NACL 1000 MG/100ML IV SOLN
INTRAVENOUS | Status: AC
  Administered 2022-07-13: 1000 mg via INTRAVENOUS
  Filled 2022-07-13: qty 300

## 2022-07-13 MED ORDER — PHENYLEPHRINE 80 MCG/ML (10ML) SYRINGE FOR IV PUSH (FOR BLOOD PRESSURE SUPPORT)
PREFILLED_SYRINGE | INTRAVENOUS | Status: AC
  Filled 2022-07-13: qty 10

## 2022-07-13 MED ORDER — LACTATED RINGERS IV BOLUS: 1000.0000 mL | Freq: Once | INTRAVENOUS | Status: DC

## 2022-07-13 MED ORDER — POTASSIUM CHLORIDE 20 MEQ PO PACK
20.0000 meq | PACK | Freq: Once | ORAL | Status: AC
  Administered 2022-07-13: 20 meq
  Filled 2022-07-13: qty 1

## 2022-07-13 MED ORDER — ORAL CARE MOUTH RINSE
15.0000 mL | OROMUCOSAL | Status: DC
  Administered 2022-07-13 (×3): 15 mL via OROMUCOSAL

## 2022-07-13 MED ORDER — LORAZEPAM 2 MG/ML IJ SOLN
2.0000 mg | Freq: Once | INTRAMUSCULAR | Status: AC
  Administered 2022-07-13: 2 mg via INTRAVENOUS
  Filled 2022-07-13: qty 1

## 2022-07-13 MED ORDER — DEXMEDETOMIDINE HCL IN NACL 400 MCG/100ML IV SOLN
0.0000 ug/kg/h | INTRAVENOUS | Status: DC
  Administered 2022-07-13: 0.4 ug/kg/h via INTRAVENOUS
  Administered 2022-07-13: 1.2 ug/kg/h via INTRAVENOUS
  Filled 2022-07-13 (×2): qty 100

## 2022-07-13 MED ORDER — FAMOTIDINE 20 MG PO TABS
20.0000 mg | ORAL_TABLET | Freq: Two times a day (BID) | ORAL | Status: DC
  Administered 2022-07-13: 20 mg
  Filled 2022-07-13: qty 1

## 2022-07-13 MED ORDER — CHLORHEXIDINE GLUCONATE CLOTH 2 % EX PADS
6.0000 | MEDICATED_PAD | Freq: Every day | CUTANEOUS | Status: DC
  Administered 2022-07-13: 6 via TOPICAL

## 2022-07-13 NOTE — ED Triage Notes (Addendum)
Patient arrived with EMS from home found unresponsive by spouse this morning , CBG=116 by EMS . Intubated by EDP at arival .

## 2022-07-13 NOTE — Progress Notes (Signed)
Upon initial assessment, patient found extremely agitated, trying to pull off bilat wrist restraints and mouthing words over ETT. Patient requesting to have ETT taken out and given an AMA form to sign. Multiple attempts were taken by RN to educate patient on the importance of staying in the hospital, however, patient was persistent in wanting to leave. Elink notified for MD to have a conversation with patient and family. CCM team paged to bedside and discussed situation at hand in length with the patient. Orders placed by MD to remove ETT, OGT, foley, etc. RT at bedside, patient extubated to 3L [place]. VS remain stable. Patient passed SLP. EEG paged to remove leads. Patient resting in bed and remains hooked up to tele monitor at this time.  Please refer to notes from Trudee Grip, Wilford Corner MD and [facility] DO.   Barbaraann Cao RN

## 2022-07-13 NOTE — Progress Notes (Signed)
VASCULAR LAB    Bilateral lower extremity venous duplex has been performed.  See CV proc for preliminary results.   Saryiah Bencosme, RVT 07/13/2022, 11:49 AM

## 2022-07-13 NOTE — Progress Notes (Addendum)
EEG tech has been informed by [facility], that patient has removed all leads from head. Will consult doctor if needing to be replaced.  Per Neurologist , leads do not need to be replaced, study can be discontinued, patient is wanting to leave AMA.

## 2022-07-13 NOTE — Progress Notes (Signed)
STAT LTM EEG hooked up and running - no initial skin breakdown - push button tested - [facility] monitoring.MRI safe leads used

## 2022-07-13 NOTE — Progress Notes (Signed)
RT NOTE:  Pt transported to CT and back without event.  

## 2022-07-13 NOTE — Progress Notes (Signed)
Overnight on-call note  Called by the CCM team regarding this patient who is now extubated and is insistent on leaving the hospital and going home.  Per ICU assessment, he is nonfocal on exam.  There is no clear reason of his presenting symptoms at this time.  He is insistent on leaving even if it is AMA.  His EEG thus far has shown no seizures till about 3 PM.  Reading after that is pending.  Additional recommendations: He is insistent on leaving.  If he decides to leave, which I suggest he should not but it is his choice to make: - Due to multiple seizures, [place] state law permits driving unless 6 months seizure-free.  This along with the other seizure precautions that I have in detail provided to the ICU via secure chat, should be given to the patient. - Keppra 500 twice daily should be continued. - He should follow with a neurologist in 2 to 4 weeks.  Plan was discussed with Cloyd Stagers from [facility] team.  Please call with questions as needed.  -- Milon Dikes, MD Neurologist [facility] Pager: [phone]

## 2022-07-13 NOTE — Progress Notes (Signed)
eLink Physician-Brief Progress Note Patient Name: Larry Rojas DOB: [birth year] MRN: [id number]   Date of Service  07/13/2022  HPI/Events of Note  Notified of this 37 year old male who was found unresponsive with suspected seizures and intubated.  Currently wide-awake on fentanyl and dexmedetomidine and requesting extubation.  He also wants to go home.  eICU Interventions  On my evaluation, patient is wide-awake and agitated.  He is signaling to me that he wants the tube out immediately and wants to go home.  I have spoken to Dr. Gaynell Face who will evaluate patient at bedside and make further decisions based on her evaluation.       Carilyn Goodpasture 07/13/2022, 7:48 PM

## 2022-07-13 NOTE — ED Notes (Addendum)
Pt to be intubated by Novella Rob along with MD polumbo at bedside.   20mg  of Etomidate admin 0536 by Angelica Chessman RN  100mg  of rocuronium admin by [facility] RN [phone] ETT tube in @ 0539 25 at the teeth

## 2022-07-13 NOTE — ED Notes (Signed)
Pt is getting ready to go to CT. Respiratory at bedside along with MD and PA.

## 2022-07-13 NOTE — Procedures (Signed)
Extubation Procedure Note  Patient Details:   Name: Larry Rojas DOB: 1986/01/01 MRN: [id number]   Airway Documentation:  Airway 8 mm (Active)  Secured at (cm) 26 cm 07/13/22 1600  Measured From Lips 07/13/22 1600  Secured Location Center 07/13/22 1600  Secured By [employer] 07/13/22 1600  Tube Holder Repositioned Yes 07/13/22 1600  Prone position No 07/13/22 1600  Cuff Pressure (cm H2O) Green OR 18-26 CmH2O 07/13/22 1600  Site Condition Dry 07/13/22 1600  Vent end date: 07/13/2022   Vent end time: 2000   Evaluation  O2 sats: stable throughout Complications: No apparent complications Patient did tolerate procedure well. Bilateral Breath Sounds: Rhonchi, Diminished   Yes, pt able to state name and communicate effectively. Placed on 3L [place] post extubation.   [name] 07/13/2022, 8:11 PM

## 2022-07-13 NOTE — ED Notes (Addendum)
OG tube placed, 75 at the lip.

## 2022-07-13 NOTE — ED Notes (Addendum)
2 of Narcan given, no response.

## 2022-07-13 NOTE — Plan of Care (Signed)
PCCM Interval Progress Note:  Contacted by E-Link to present to bedside for patient who was intubated and insisting on leaving AMA.  Patient admitted earlier in the day for AMS/unresponsiveness, CT head and UDS negative.  Presumed seizures with seizure activity documented by ED.  Patient was intubated for airway protection and placed on LTM EEG monitoring for capturing of further seizures.  Around 1500, patient EEG was reviewed and negative for seizure activity and propofol was weaned to off.  Upon weaning of propofol, patient became alert and appeared to be oriented with hand gestures, mouthing words and attempting to type words on his phone to communicate.  He was able to nod appropriately to questions and follow all commands even on Precedex 2.0 and fentanyl 200.  Sedation was discontinued and patient remained appropriate from a mental status standpoint and was subsequently extubated at 2011.  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  His wife, at bedside, disagreed with this decision but ultimately stated that it was up to the patient.  Patient passed bedside swallow evaluation with RN, Sam, and Foley catheter was removed.  I spoke with Dr. Wilford Corner (on-call for Neurology) who echoed our concerns with patient leaving, but ultimately agreed that if he was back to his mental status baseline that he could sign out AGAINST MEDICAL ADVICE.  Dr. Wilford Corner advised new medication of Keppra 500 mg twice daily; 1 dose of this medication was ordered for this evening prior to patient leaving the hospital to ensure no gaps in coverage and prescription was sent to patient's home [facility] in [place].  Dr. Wilford Corner additionally provided me with seizure precaution information to provide to patient; this was printed and given to/reviewed with patient and a signed acknowledgment of this information put in patient's chart.    Patient subsequently signed AMA paperwork.  Patient plans  to leave the hospital after EEG leads have been removed.  Tim Lair, PA-C [facility] 07/13/22 9:15 PM  Please see [email or web address] for pager details.  From 7A-7P if no response, please call [phone] After hours, please call ELink [phone]

## 2022-07-13 NOTE — ED Notes (Signed)
Pt has returned from CT.  

## 2022-07-13 NOTE — H&P (Addendum)
NAME:  Larry Rojas, MRN:  [id number], DOB:  March 10, 1985, LOS: 0 ADMISSION DATE:  07/13/2022, CONSULTATION DATE:  5/30 REFERRING MD:  Dr. Nicanor Alcon, CHIEF COMPLAINT:  Unresponsive   History of Present Illness:  37 year old male with PMH as below, which is significant for ETOH use and cervical spondylosis s/p C4- C5 ACDF in 01/16/22 by Dr. Lorenso Courier at [place].  Wife reports last week c/o of LLQ/ lateral pain with constipation and was placed on Augmentin by Dr. Lorenso Courier.  They traveled to the [facility] Thursday- Monday and last evening patient was in his normal state of health.  States he came into bedroom c/o of SOB then went unresponsive.  Upon EMS arrive the patient was unresponsive with a pulse.   She denies any known fever, chills, rash (other than numerous mosquito bites they both acquired), headache, visual changes, chest pain, N/V/D, bloody stools, or urinary symptoms.  No known illicit drug use and drinks ~3 beers every other day.  No known drug misuse.  No prior seizure history.   He was transported to the emergency department, where he was intubated upon arrival for airway protection after showing no response to narcan.  Just prior to intubation 30-45 seconds of shaking and roving eye movements were witnessed raising concern for seizure.  Tongue noted to be large raising concern for anaphylaxis prompting the administration of epinephrine. Pupils were bilaterally dilated with minimal reaction to light.   He has been afebrile, normotensive, and normoxia.  CT of the head was unremarkable.  Neurology consulted for seizures and loaded with keppra, pending EEG.  ED RN reports patient reaching for ETT but not following commands, sedated on propofol.  PCCM was asked to admit to ICU for ventilator management.   Labs significant for K 3.7, bicarb 21, glucose 135, sCr 0.95, ALT 51, WBC 8.5, acetaminophen level 12, ETOH 228, EKG NSR, QTC 458, partial RBBB, UDS neg.    Multiple rx from home including norvasc,  synthroid, robaxin, flexeril, zanaflex, xanax, gabapentin, oxy (rx from April/ empty), omeprazole   Pertinent  Medical History   has a past medical history of Asthma, Back pain, Borderline diabetic, Diverticulitis (08/23/2021), Hypertension, and Thyroid disease.  Significant Hospital Events: Including procedures, antibiotic start and stop dates in addition to other pertinent events   5/30 admit  Interim History / Subjective:  EEG being placed  Objective   Blood pressure (!) 156/91, pulse 88, temperature (!) 96.7 F (35.9 C), resp. rate (!) 31, height 6\' 2"  (1.88 m), SpO2 100 %.    Vent Mode: PRVC FiO2 (%):  [50 %-100 %] 50 % Set Rate:  [18 bmp] 18 bmp Vt Set:  [650 mL] 650 mL PEEP:  [5 cmH20] 5 cmH20 Plateau Pressure:  [15 cmH20] 15 cmH20  No intake or output data in the 24 hours ending 07/13/22 0630 There were no vitals filed for this visit.  Examination: Propofol 25, fentanyl gtt starting General:  critically ill well nourished adult male in NAD on MV HEENT: MM pink/moist, ETT 8/26, OGT, pupils 5-6/ initially sluggish on R but then both brisk, anicteric, no nuchal rigidity noted Neuro: does not open eyes or follow commands, MAE spont CV: rr, NSR, no murmur PULM:  MV, no airtrapping/ bronchospasm, clear GI: soft, bs hypo, NT/ ND, foley- cyu Extremities: warm/dry, no LE edema  Skin: multiple small circular slightly raised papular areas to LE (concentrated around feet/ ankles)  Resolved Hospital Problem list    Assessment & Plan:   Acute  toxic/ metabolic encephalopathy: Found unresponsive. No response to narcan.  ETOH level elevated, 228. Filled scripts for #40 oxycodone 4/24, and #60 gabapentin 5/20, and clonazepam #90  5/10. Ongoing mydriasis. CT unremarkable. UDS negative.  Afebrile/ NSR.  Qtc/ QRS normal.   - ddx broad, r/o ? anticholinergic toxidrome  vs possible unknown toxic ingestion vs withdrawal seizure vs infectious/ inflammatory CNS process  Seizure, new onset r/o  SCSE - 30-45 seconds of shaking noted in ED P:  - admit ICU, cont supportive care - appreciate Neurology input - AED per Neurology> keppra - EEG pending - MRI brain when able - plan for LP in ICU - UDS neg despite benzo's and opiate rx.  ?withdrawal seizure - LR for MIVF  - check infectious workup> PCT, BC, UA, send CSF for reflex panel and [place] virus given multiple mosquito bites.   - check TSH - monitor Qtc and QRS for possible drug intoxication/ OD> currently normal  - tylenol level 12. LFTs ok.  Recheck in 6hrs - Maintain neuro protective measures; goal for eurothermia, euglycemia, eunatermia, normoxia, and PCO2 goal of 35-40 - Seizure precautions  - empiric thiamine/ folate/ MVI.  No previous reported issues to ETOH abuse.  Monitor for ETOH/ polypharmacy withdrawal  Acute respiratory insufficiency related to above - c/o of acute SOB prior to unresponsive episode and possible tongue swelling.  No significant hypoxia noted or bronchospasm Hx of asthma, no home meds - full MV support, 4-8cc/kg IBW with goal Pplat <30 and DP<15  - VAP prevention protocol/ PPI - PAD protocol for sedation> propofol/ fentanyl for RASS goal 0 to -2 w/ bowel regimen - wean FiO2 as able for SpO2 >92%  - no reports of rash, evidence of bronchospasm, or obvious angioedema, defer steroids/ angioedema workup for now -  prn albuterol - daily SAT & SBT when mental status allows/ seizures r/o, ensure cuff leak prior to extubation - CXR without acute process - given recent travel and c/o sudden onset SOB, but no significant hypoxia/ tachycardia here, consider PE> check LE dupplex to r/o DVT, consider CTA PE  HTN - holding home amlodipine - goal MAP > 65, remains normotensive   GERD - pepcid  Hypothyroidism - pending TSH/ FT4 - hold home synthroid for now  Neck/ back pain after C4 - C5 ACDF surgery 01/2022 - hold home robaxin, zanaflex, flexeril, gabapentin, xanax, oxy - PAD protocol as  above  Best Practice (right click and "Reselect all SmartList Selections" daily)   Diet/type: NPO DVT prophylaxis: SCD/ SQ heparin  GI prophylaxis: PPI Lines: N/A Foley:  Yes, and it is still needed Code Status:  full code Last date of multidisciplinary goals of care discussion [5/30]  Wife, Joice Lofts [phone] updated at bedside  Labs   CBC: Recent Labs  Lab 07/13/22 0537 07/13/22 0616  WBC 8.2  --   NEUTROABS 2.7  --   HGB 14.2 13.9  HCT 40.1 41.0  MCV 96.4  --   PLT 204  --     Basic Metabolic Panel: Recent Labs  Lab 07/13/22 0537 07/13/22 0616  NA 139 141  K 3.7 3.5  CL 104  --   CO2 21*  --   GLUCOSE 135*  --   BUN 13  --   CREATININE 0.95  --   CALCIUM 8.1*  --    GFR: CrCl cannot be calculated (Unknown ideal weight.). Recent Labs  Lab 07/13/22 0537  WBC 8.2    Liver Function Tests: Recent Labs  Lab 07/13/22 0537  AST 41  ALT 51*  ALKPHOS 88  BILITOT 0.3  PROT 6.3*  ALBUMIN 3.7   No results for input(s): "LIPASE", "AMYLASE" in the last 168 hours. No results for input(s): "AMMONIA" in the last 168 hours.  ABG    Component Value Date/Time   PHART 7.324 (L) 07/13/2022 0616   PCO2ART 42.5 07/13/2022 0616   PO2ART 489 (H) 07/13/2022 0616   HCO3 22.4 07/13/2022 0616   TCO2 24 07/13/2022 0616   ACIDBASEDEF 4.0 (H) 07/13/2022 0616   O2SAT 100 07/13/2022 0616     Coagulation Profile: No results for input(s): "INR", "PROTIME" in the last 168 hours.  Cardiac Enzymes: No results for input(s): "CKTOTAL", "CKMB", "CKMBINDEX", "TROPONINI" in the last 168 hours.  HbA1C: No results found for: "HGBA1C"  CBG: Recent Labs  Lab 07/13/22 0531  GLUCAP 124*    Review of Systems:   Unable as patient is intubated and sedated on MV  Past Medical History:  He,  has a past medical history of Asthma, Back pain, Borderline diabetic, Diverticulitis (08/23/2021), Hypertension, and Thyroid disease.   Surgical History:  No past surgical history on  file.   Social History:   reports that he has never smoked. He has never used smokeless tobacco. He reports current alcohol use. He reports that he does not currently use drugs.   Family History:  His family history is not on file.   Allergies Allergies  Allergen Reactions   Fluoxetine Other (See Comments)    Suicidal ideation Suicidal ideation    Trazodone And Nefazodone Other (See Comments)    Hallucinations    Hydrocodone      Home Medications  Prior to Admission medications   Medication Sig Start Date End Date Taking? Authorizing Provider  Pending med rec  Critical care time: 55 mins      Posey Boyer, MSN, AG-ACNP-BC [facility] 07/13/2022, 8:06 AM  See Amion for pager If no response to pager, please call PCCM consult pager After 7:00 pm call Elink

## 2022-07-13 NOTE — ED Notes (Signed)
Pt being taken to CT, RT at bedside.

## 2022-07-13 NOTE — Progress Notes (Signed)
Cross covering ICU  Called to assess pt as he is alert and texting on his phone. Wife is at bedside. I will extubate pt. I have advised him to remain hospitalized thru the evening for neuro to reassess however pt texts that he has small children and work he needs to get back to and wants to sign out ama.   I express we have no definitive diagnosis or medication therapy and that he should stay.  He texts that he will follow up with doctor. Wife is aware and states that she will take him home if that is what he wants.   We will stop sedation. Remove ett, eeg and foley and hope pt decides to remain in hospital for the evening for neuro. Otherwise again, he will need to sign ama.

## 2022-07-13 NOTE — ED Provider Notes (Addendum)
  [place] EMERGENCY DEPARTMENT AT [facility] Provider Note      Procedures Procedure Name: Intubation Date/Time: 07/13/2022 5:41 AM  Performed by: Roxy Horseman, PA-CPre-anesthesia Checklist: Patient identified, Patient being monitored, Emergency Drugs available, Timeout performed and Suction available Oxygen Delivery Method: Non-rebreather mask Preoxygenation: Pre-oxygenation with 100% oxygen Induction Type: Rapid sequence Ventilation: Mask ventilation without difficulty Laryngoscope Size: Glidescope and 4 Tube size: 7.5 mm Number of attempts: 1 Airway Equipment and Method: Video-laryngoscopy Placement Confirmation: ETT inserted through vocal cords under direct vision, CO2 detector and Breath sounds checked- equal and bilateral Secured at: 25 cm Tube secured with: ETT holder Dental Injury: Teeth and Oropharynx as per pre-operative assessment  Difficulty Due To: Difficult Airway- due to large tongue         Roxy Horseman, PA-C 07/13/22 0558    Roxy Horseman, PA-C 07/13/22 0559    Palumbo, April, MD 07/13/22 1610    Nicanor Alcon, April, MD 07/13/22 [id number]

## 2022-07-13 NOTE — Progress Notes (Signed)
Pt transported from ED room 31 to 2M09 without any complications.

## 2022-07-13 NOTE — ED Provider Notes (Signed)
3000mg  IV keppra ordered per Dr. Wilford Corner for seizure.   Roxy Horseman, PA-C 07/13/22 1610    Nicanor Alcon, April, MD 07/13/22 [phone]

## 2022-07-13 NOTE — Progress Notes (Signed)
LTM EEG discontinued - no skin breakdown at unhook. [facility] notified 

## 2022-07-13 NOTE — Progress Notes (Signed)
Pt transported on vent to MRI and back to 2M09 without complications.

## 2022-07-13 NOTE — Consult Note (Signed)
Neurology Consultation  Reason for Consult: Unresponsiveness/altered mental status, concern for seizures Referring Physician: Dr. Daun Peacock  CC: Found unresponsive, witnessed seizure activity  History is obtained from: Chart, ER provider  HPI: Larry Rojas is a 37 y.o. male past medical history of asthma, back pain, hypertension, diverticulitis, presented to the emergency room via EMS after he started shortness of breath and asked the spouse to call EMS and became unresponsive.  I was able to speak with the spouse over the phone.  She called EMS and they asked her to lay him down on the ground.  She had great difficulty to put him down.  She was unable to check his pulses.  By the time she got him down to the ground, EMS arrived.  They were concerned for anaphylaxis, give him epi. They did not have to perform CPR as he had pulses. Arrived in the ER around 5:30 AM, he had 30 to 45 seconds of shaking with roving eye movements and disconjugate gaze concerning for seizure activity by the EDP.  Unable to protect his airway on arrival.  ED provider concern for possible drug overuse given recent back surgery and complaints of ongoing pain.  Wife does seem to think he has taken excessive medication or any kind of illicit drugs.  She denied any drug use.  Occasional alcohol use. Emergently intubated.  Neurology consulted for concern for seizure activity. Going through the chart, no documented history of seizures. On arrival was also noted to have bilateral mydriasis with pupils about 9 mm barely reactive to about 7 mm bilaterally.  Received Narcan.  Also received epinephrine-there was concern on scene that he probably has some sort of anaphylaxis reaction due to increased size of his tongue. Neurology was called right after RSI  LKW: Sometime last night-unclear IV thrombolysis given?: no, unclear last known well Premorbid modified Rankin scale (mRS): 0   ROS: Unable to ascertain  Past Medical  History:  Diagnosis Date   Asthma    Back pain    Borderline diabetic    Diverticulitis 08/23/2021   Hypertension    Thyroid disease     No family history on file.  Social History:   reports that he has never smoked. He has never used smokeless tobacco. He reports current alcohol use. He reports that he does not currently use drugs.  Medications  Current Facility-Administered Medications:    LORazepam (ATIVAN) injection 1 mg, 1 mg, Intravenous, Once, Roxy Horseman, PA-C  Current Outpatient Medications:    amLODipine (NORVASC) 5 MG tablet, Take 1 tablet (5 mg total) by mouth daily., Disp: 30 tablet, Rfl: 3   ciprofloxacin (CIPRO) 500 MG tablet, Take 1 tablet (500 mg total) by mouth 2 (two) times daily., Disp: 20 tablet, Rfl: 0   clonazePAM (KLONOPIN) 0.5 MG tablet, Take 0.5 mg by mouth in the morning, at noon, and at bedtime., Disp: , Rfl:    levothyroxine (SYNTHROID) 25 MCG tablet, Take 25 mcg by mouth daily., Disp: , Rfl:    methocarbamol (ROBAXIN) 500 MG tablet, Take 1 tablet (500 mg total) by mouth 2 (two) times daily., Disp: 20 tablet, Rfl: 0   metroNIDAZOLE (FLAGYL) 500 MG tablet, Take 1 tablet (500 mg total) by mouth 3 (three) times daily., Disp: 30 tablet, Rfl: 0   naproxen (NAPROSYN) 500 MG tablet, Take 1 tablet (500 mg total) by mouth 2 (two) times daily., Disp: 10 tablet, Rfl: 0   niacin (NIASPAN) 500 MG CR tablet, Take 500 mg by mouth  daily., Disp: , Rfl:    omeprazole (PRILOSEC) 20 MG capsule, Take 20 mg by mouth daily as needed., Disp: , Rfl:    potassium chloride SA (KLOR-CON M) 20 MEQ tablet, Take 1 tablet (20 mEq total) by mouth 2 (two) times daily., Disp: 10 tablet, Rfl: 0  Exam: Current vital signs: BP (!) 156/91   Pulse (!) 116   Resp 15   Ht 6\' 2"  (1.88 m)   SpO2 100%   BMI 32.10 kg/m  Vital signs in last 24 hours: Pulse Rate:  [116] 116 (05/30 0545) Resp:  [15] 15 (05/30 0545) BP: (100-156)/(48-91) 156/91 (05/30 0545) SpO2:  [100 %] 100 % (05/30  0545) FiO2 (%):  [100 %] 100 % (05/30 0539) Consult was called right after RSI-extremely limited exam General: Intubated HEENT: Normocephalic atraumatic Lungs: Clear Cardiovascular: Regular rhythm Neurological exam limited due to paralytic administration for RSI Mental status, speech and language: Does not open eyes to voice, nonresponsive to noxious stimulation, nonverbal. Cranial nerves: Pupils are equal 5 mm briskly reactive, unable to elicit corneal reflexes, unable to elicit cough or gag at this time-likely related to chemical paralysis.  Does not blink to threat from either side. Motor examination: No spontaneous movement, no movement to noxious stimulation-again within limitations of recent paralytic administration. Sensation: As above    Labs I have reviewed labs in epic and the results pertinent to this consultation are:  CBC    Component Value Date/Time   WBC 8.2 07/13/2022 0537   RBC 4.16 (L) 07/13/2022 0537   HGB 13.9 07/13/2022 0616   HCT 41.0 07/13/2022 0616   PLT 204 07/13/2022 0537   MCV 96.4 07/13/2022 0537   MCH 34.1 (H) 07/13/2022 0537   MCHC 35.4 07/13/2022 0537   RDW 11.4 (L) 07/13/2022 0537   LYMPHSABS 4.6 (H) 07/13/2022 0537   MONOABS 0.6 07/13/2022 0537   EOSABS 0.2 07/13/2022 0537   BASOSABS 0.1 07/13/2022 0537    CMP     Component Value Date/Time   NA 141 07/13/2022 0616   K 3.5 07/13/2022 0616   CL 104 07/13/2022 0537   CO2 21 (L) 07/13/2022 0537   GLUCOSE 135 (H) 07/13/2022 0537   BUN 13 07/13/2022 0537   CREATININE 0.95 07/13/2022 0537   CALCIUM 8.1 (L) 07/13/2022 0537   PROT 6.3 (L) 07/13/2022 0537   ALBUMIN 3.7 07/13/2022 0537   AST 41 07/13/2022 0537   ALT 51 (H) 07/13/2022 0537   ALKPHOS 88 07/13/2022 0537   BILITOT 0.3 07/13/2022 0537   GFRNONAA >60 07/13/2022 0537   GFRAA >60 10/31/2018 0300   Imaging I have reviewed the images obtained:  CT-head-no acute changes, no bleed.  Formal read pending  Assessment:  37 year old  past history of asthma, back pain, hypertension and diverticulitis presented to the emergency room via EMS after having shortness of breath, and asking the spouse to call EMS and then became unresponsive.  Upon EMS arrival, they were concerned for anaphylaxis-given epinephrine.  Never lost pulses.  Intubated in the emergency room due to inability to protect airway.  Also concern for 30 to 45 seconds of witnessed shaking and disconjugate gaze by the ED provider concerning for seizure, for which neurological consultation obtained Also concern for possible prescription medication overuse given recent surgery and prescription of benzodiazepine as well as muscle relaxants. Unclear etiology  Impression: Concern for seizure, subclinical status epilepticus, evaluate for drug overdose  Recommendations: Stat EEG Ativan 1 mg IV x 1. Urine toxicology screen No  leukocytosis, no neck stiffness-do not see an indication for stat LP at this time Further recommendations based on study EEG   Addendum After my evaluation of the patient, and preliminary recommendations-got a call from the EDP who thinks patient might be seizing again.  Updated recommendations: Load with Keppra 3000 mg IV x 1 Stat EEG At some point will need an MRI brain as well.  Will ask for MRI safe leads to be put on.  Neurology will continue to follow with you.  Plan discussed with Dr. Daun Peacock -- Milon Dikes, MD Neurologist [facility] Pager: [phone]   CRITICAL CARE ATTESTATION Performed by: Milon Dikes, MD Total critical care time: 33 minutes Critical care time was exclusive of separately billable procedures and treating other patients and/or supervising APPs/Residents/Students Critical care was necessary to treat or prevent imminent or life-threatening deterioration. This patient is critically ill and at significant risk for neurological worsening and/or death and care requires constant monitoring. Critical  care was time spent personally by me on the following activities: development of treatment plan with patient and/or surrogate as well as nursing, discussions with consultants, evaluation of patient's response to treatment, examination of patient, obtaining history from patient or surrogate, ordering and performing treatments and interventions, ordering and review of laboratory studies, ordering and review of radiographic studies, pulse oximetry, re-evaluation of patient's condition, participation in multidisciplinary rounds and medical decision making of high complexity in the care of this patient.

## 2022-07-13 NOTE — Care Plan (Signed)
Reviewed EEG.  No seizures.  Will start weaning propofol to stop.  Discussed plan with ICU team.  Charlsie Quest

## 2022-07-13 NOTE — ED Provider Notes (Signed)
EMERGENCY DEPARTMENT AT [facility] Provider Note   CSN: [id number] Arrival date & time: 07/13/22  4098     History  Chief Complaint  Patient presents with   Unresponsive    Larry Rojas is a 37 y.o. male.  The history is provided by the EMS personnel and the spouse. The history is limited by the condition of the patient.  Illness Location:  Patient complained of LUQ abdominal pain and [place] on [place] by Dr. Lowell Guitar Quality:  Then tonight was breathing rapidly and asked wife to call 911 and stopped breathing Severity:  Severe Onset quality:  Gradual Timing:  Constant Progression:  Worsening Chronicity:  New Relieved by:  Nothing Worsened by:  Nothing Ineffective treatments:  Epi SQ by EMS Associated symptoms: no fever and no vomiting  Myalgias: on Neurontin, oxycodon, klonopin, flexeril, robaxin and zanaflex.      Home Medications Prior to Admission medications   Medication Sig Start Date End Date Taking? Authorizing Provider  amLODipine (NORVASC) 5 MG tablet Take 1 tablet (5 mg total) by mouth daily. 10/26/18   Desai, Rahul P, PA-C  ciprofloxacin (CIPRO) 500 MG tablet Take 1 tablet (500 mg total) by mouth 2 (two) times daily. 08/23/21   Dione Booze, MD  clonazePAM (KLONOPIN) 0.5 MG tablet Take 0.5 mg by mouth in the morning, at noon, and at bedtime. 05/31/20   [provider]  levothyroxine (SYNTHROID) 25 MCG tablet Take 25 mcg by mouth daily. 02/11/21   [provider]  methocarbamol (ROBAXIN) 500 MG tablet Take 1 tablet (500 mg total) by mouth 2 (two) times daily. 04/05/21   Pollyann Savoy, MD  metroNIDAZOLE (FLAGYL) 500 MG tablet Take 1 tablet (500 mg total) by mouth 3 (three) times daily. 08/23/21   Dione Booze, MD  naproxen (NAPROSYN) 500 MG tablet Take 1 tablet (500 mg total) by mouth 2 (two) times daily. 04/05/21   Pollyann Savoy, MD  niacin (NIASPAN) 500 MG CR tablet Take 500 mg by mouth daily. 03/31/21    [provider]  omeprazole (PRILOSEC) 20 MG capsule Take 20 mg by mouth daily as needed.    [provider]  potassium chloride SA (KLOR-CON M) 20 MEQ tablet Take 1 tablet (20 mEq total) by mouth 2 (two) times daily. 08/23/21   Dione Booze, MD      Allergies    Fluoxetine, Trazodone and nefazodone, and Hydrocodone    Review of Systems   Review of Systems  Unable to perform ROS: Acuity of condition  Constitutional:  Negative for fever.  Gastrointestinal:  Negative for vomiting.  Musculoskeletal:  Myalgias: on Neurontin, oxycodon, klonopin, flexeril, robaxin and zanaflex.    Physical Exam Updated Vital Signs BP (!) 156/91   Pulse 88   Temp (!) 96.7 F (35.9 C)   Resp (!) 31   Ht 6\' 2"  (1.88 m)   SpO2 100%   BMI 32.10 kg/m  Physical Exam Vitals and nursing note reviewed. Exam conducted with a chaperone present.  Constitutional:      General: He is not in acute distress.    Appearance: He is well-developed. He is not diaphoretic.  HENT:     Head: Normocephalic and atraumatic.     Nose: Nose normal.  Eyes:     Conjunctiva/sclera: Conjunctivae normal.     Comments: Dilated disconjugate gaze   Cardiovascular:     Rate and Rhythm: Normal rate and regular rhythm.     Pulses:  Normal pulses.     Heart sounds: Normal heart sounds.  Pulmonary:     Effort: Pulmonary effort is normal.     Breath sounds: Normal breath sounds. No wheezing or rales.  Abdominal:     General: Bowel sounds are normal.     Palpations: Abdomen is soft.     Tenderness: There is no abdominal tenderness. There is no guarding or rebound.  Musculoskeletal:        General: Normal range of motion.     Cervical back: Normal range of motion and neck supple.  Skin:    General: Skin is warm and dry.     Capillary Refill: Capillary refill takes less than 2 seconds.  Neurological:     Deep Tendon Reflexes: Reflexes normal.     Comments: Seizures started in the ED and ended and restarted       ED Results / Procedures / Treatments   Labs (all labs ordered are listed, but only abnormal results are displayed) Results for orders placed or performed during the hospital encounter of 07/13/22  Comprehensive metabolic panel  Result Value Ref Range   Sodium 139 135 - 145 mmol/L   Potassium 3.7 3.5 - 5.1 mmol/L   Chloride 104 98 - 111 mmol/L   CO2 21 (L) 22 - 32 mmol/L   Glucose, Bld 135 (H) 70 - 99 mg/dL   BUN 13 6 - 20 mg/dL   Creatinine, Ser 1.61 0.61 - 1.24 mg/dL   Calcium 8.1 (L) 8.9 - 10.3 mg/dL   Total Protein 6.3 (L) 6.5 - 8.1 g/dL   Albumin 3.7 3.5 - 5.0 g/dL   AST 41 15 - 41 U/L   ALT 51 (H) 0 - 44 U/L   Alkaline Phosphatase 88 38 - 126 U/L   Total Bilirubin 0.3 0.3 - 1.2 mg/dL   GFR, Estimated >09 >60 mL/min   Anion gap 14 5 - 15  Salicylate level  Result Value Ref Range   Salicylate Lvl <7.0 (L) 7.0 - 30.0 mg/dL  Acetaminophen level  Result Value Ref Range   Acetaminophen (Tylenol), Serum 12 10 - 30 ug/mL  Ethanol  Result Value Ref Range   Alcohol, Ethyl (B) 228 (H) <10 mg/dL  CBC WITH DIFFERENTIAL  Result Value Ref Range   WBC 8.2 4.0 - 10.5 K/uL   RBC 4.16 (L) 4.22 - 5.81 MIL/uL   Hemoglobin 14.2 13.0 - 17.0 g/dL   HCT 45.4 09.8 - 11.9 %   MCV 96.4 80.0 - 100.0 fL   MCH 34.1 (H) 26.0 - 34.0 pg   MCHC 35.4 30.0 - 36.0 g/dL   RDW 14.7 (L) 82.9 - 56.2 %   Platelets 204 150 - 400 K/uL   nRBC 0.0 0.0 - 0.2 %   Neutrophils Relative % 33 %   Neutro Abs 2.7 1.7 - 7.7 K/uL   Lymphocytes Relative 56 %   Lymphs Abs 4.6 (H) 0.7 - 4.0 K/uL   Monocytes Relative 7 %   Monocytes Absolute 0.6 0.1 - 1.0 K/uL   Eosinophils Relative 2 %   Eosinophils Absolute 0.2 0.0 - 0.5 K/uL   Basophils Relative 1 %   Basophils Absolute 0.1 0.0 - 0.1 K/uL   Immature Granulocytes 1 %   Abs Immature Granulocytes 0.04 0.00 - 0.07 K/uL  CBG monitoring, ED  Result Value Ref Range   Glucose-Capillary 124 (H) 70 - 99 mg/dL  I-Stat arterial blood gas, ED  Result Value Ref  Range   pH,  Arterial 7.324 (L) 7.35 - 7.45   pCO2 arterial 42.5 32 - 48 mmHg   pO2, Arterial 489 (H) 83 - 108 mmHg   Bicarbonate 22.4 20.0 - 28.0 mmol/L   TCO2 24 22 - 32 mmol/L   O2 Saturation 100 %   Acid-base deficit 4.0 (H) 0.0 - 2.0 mmol/L   Sodium 141 135 - 145 mmol/L   Potassium 3.5 3.5 - 5.1 mmol/L   Calcium, Ion 1.15 1.15 - 1.40 mmol/L   HCT 41.0 39.0 - 52.0 %   Hemoglobin 13.9 13.0 - 17.0 g/dL   Patient temperature 82.9 F    Collection site RADIAL, ALLEN'S TEST ACCEPTABLE    Drawn by Operator    Sample type ARTERIAL    CT Head Wo Contrast  Result Date: 07/13/2022 CLINICAL DATA:  37 year old male found down. Unresponsive. Intubated. EXAM: CT HEAD WITHOUT CONTRAST TECHNIQUE: Contiguous axial images were obtained from the base of the skull through the vertex without intravenous contrast. RADIATION DOSE REDUCTION: This exam was performed according to the departmental dose-optimization program which includes automated exposure control, adjustment of the mA and/or kV according to patient size and/or use of iterative reconstruction technique. COMPARISON:  Head CT 04/05/2021.  Brain MRI 10/26/2017. FINDINGS: Brain: Normal cerebral volume. No midline shift, ventriculomegaly, mass effect, evidence of mass lesion, intracranial hemorrhage or evidence of cortically based acute infarction. Gray-white matter differentiation is within normal limits throughout the brain. Vascular: No suspicious intracranial vascular hyperdensity. Skull: Stable and intact.  No acute osseous abnormality identified. Sinuses/Orbits: Mild paranasal sinus mucosal thickening in the setting of intubation. Tympanic cavities and mastoids remain clear. Other: Partially visible endotracheal tube, oral enteric tube. Fluid in the pharynx. Orbit and scalp soft tissues are within normal limits. IMPRESSION: 1. Normal noncontrast CT appearance of the brain. 2. Intubated.  Mild paranasal sinus opacification. Electronically Signed   By: Odessa Fleming M.D.   On: 07/13/2022 06:18   DG Chest Portable 1 View  Result Date: 07/13/2022 CLINICAL DATA:  37 year old male status post endotracheal tube and orogastric tube placement. EXAM: PORTABLE CHEST 1 VIEW COMPARISON:  Chest x-ray 05/07/2022. FINDINGS: An endotracheal tube is in place with tip 6.2 cm above the carina. Orogastric tube noted with tip coiled in the stomach in proximity to the gastroesophageal junction. Lung volumes are low. No consolidative airspace disease. No pleural effusions. No pneumothorax. No pulmonary nodule or mass noted. Pulmonary vasculature and the cardiomediastinal silhouette are within normal limits. IMPRESSION: 1. Support apparatus, as above. 2. Low lung volumes without radiographic evidence of acute cardiopulmonary disease. Electronically Signed   By: Trudie Reed M.D.   On: 07/13/2022 06:13    EKG  EKG Interpretation  Date/Time:  Thursday Jul 13 2022 05:34:16 EDT Ventricular Rate:  93 PR Interval:  145 QRS Duration: 114 QT Interval:  368 QTC Calculation: 458 R Axis:   73 Text Interpretation: Sinus rhythm Incomplete right bundle branch block Confirmed by Nicanor Alcon, Rogers Ditter ([id number]) on 07/13/2022 6:42:48 AM         Radiology CT Head Wo Contrast  Result Date: 07/13/2022 CLINICAL DATA:  37 year old male found down. Unresponsive. Intubated. EXAM: CT HEAD WITHOUT CONTRAST TECHNIQUE: Contiguous axial images were obtained from the base of the skull through the vertex without intravenous contrast. RADIATION DOSE REDUCTION: This exam was performed according to the departmental dose-optimization program which includes automated exposure control, adjustment of the mA and/or kV according to patient size and/or use of iterative reconstruction technique. COMPARISON:  Head CT 04/05/2021.  Brain  MRI 10/26/2017. FINDINGS: Brain: Normal cerebral volume. No midline shift, ventriculomegaly, mass effect, evidence of mass lesion, intracranial hemorrhage or evidence of cortically  based acute infarction. Gray-white matter differentiation is within normal limits throughout the brain. Vascular: No suspicious intracranial vascular hyperdensity. Skull: Stable and intact.  No acute osseous abnormality identified. Sinuses/Orbits: Mild paranasal sinus mucosal thickening in the setting of intubation. Tympanic cavities and mastoids remain clear. Other: Partially visible endotracheal tube, oral enteric tube. Fluid in the pharynx. Orbit and scalp soft tissues are within normal limits. IMPRESSION: 1. Normal noncontrast CT appearance of the brain. 2. Intubated.  Mild paranasal sinus opacification. Electronically Signed   By: Odessa Fleming M.D.   On: 07/13/2022 06:18   DG Chest Portable 1 View  Result Date: 07/13/2022 CLINICAL DATA:  37 year old male status post endotracheal tube and orogastric tube placement. EXAM: PORTABLE CHEST 1 VIEW COMPARISON:  Chest x-ray 05/07/2022. FINDINGS: An endotracheal tube is in place with tip 6.2 cm above the carina. Orogastric tube noted with tip coiled in the stomach in proximity to the gastroesophageal junction. Lung volumes are low. No consolidative airspace disease. No pleural effusions. No pneumothorax. No pulmonary nodule or mass noted. Pulmonary vasculature and the cardiomediastinal silhouette are within normal limits. IMPRESSION: 1. Support apparatus, as above. 2. Low lung volumes without radiographic evidence of acute cardiopulmonary disease. Electronically Signed   By: Trudie Reed M.D.   On: 07/13/2022 06:13    Procedures Procedures    Medications Ordered in ED Medications  propofol (DIPRIVAN) 1000 MG/100ML infusion (has no administration in time range)  levETIRAcetam (KEPPRA) IVPB 1000 mg/100 mL premix (has no administration in time range)    Followed by  levETIRAcetam (KEPPRA) IVPB 1000 mg/100 mL premix (has no administration in time range)    Followed by  levETIRAcetam (KEPPRA) IVPB 1000 mg/100 mL premix (has no administration in time range)   LORazepam (ATIVAN) injection 1 mg (1 mg Intravenous Given 07/13/22 0619)  LORazepam (ATIVAN) injection 2 mg (2 mg Intravenous Given 07/13/22 5409)    ED Course/ Medical Decision Making/ A&P                             Medical Decision Making Patient was on multiple medications for LUQ pain and then was unresponsive   Amount and/or Complexity of Data Reviewed Independent Historian: spouse and EMS    Details: See above  External Data Reviewed: notes.    Details: Previous notes reviewed  Labs: ordered.    Details: Normal sodium 139, normal potassium 3.7, normal creatinine .95, normal LFTs tylenol level 12, negative salicylate, alcohol 228, ALT slight elevation 51  Radiology: ordered and independent interpretation performed.    Details: Negative head CT by me no pulmonary edema by me  ECG/medicine tests: ordered and independent interpretation performed. Decision-making details documented in ED Course. Discussion of management or test interpretation with external provider(s): Case d/w Dr. Everardo All of ICU who will admit Case d/w Dr. Wilford Corner, EEG and MRI and UDS and load with 3000 mg of keppra   Risk Prescription drug management. Decision regarding hospitalization. Risk Details: Patient with likely polysubstance overdose and multiple witnessed seizures in the ED.    Critical Care Total time providing critical care: 90 minutes (Bedside care, consults, drips )    Final Clinical Impression(s) / ED Diagnoses Final diagnoses:  Respiratory arrest [facility])   The patient appears reasonably stabilized for admission considering the current resources, flow, and capabilities available in  the ED at this time, and I doubt any other [facility] requiring further screening and/or treatment in the ED prior to admission.  Rx / DC Orders ED Discharge Orders     None         Odelia Graciano, MD 07/13/22 [phone]

## 2022-07-14 ENCOUNTER — Inpatient Hospital Stay (HOSPITAL_COMMUNITY): Payer: Medicaid Other

## 2022-07-14 LAB — CULTURE, BLOOD (ROUTINE X 2)
Culture: NO GROWTH
Special Requests: ADEQUATE

## 2022-07-14 NOTE — Procedures (Addendum)
Patient Name: Larry Rojas  MRN: [id number]  Epilepsy Attending: Charlsie Quest  Referring Physician/Provider: Milon Dikes, MD  Duration: 07/13/2022 0758 to 2240  Patient history: 37 year old presented to the emergency room via EMS after having shortness of breath, and asking the spouse to call EMS and then became unresponsive. Also concern for 30 to 45 seconds of witnessed shaking and disconjugate gaze by the ED provider concerning for seizure. EEG to evaluate for seizure.   Level of alertness:  comatose --> awake, asleep  AEDs during EEG study: Propofol, LEV  Technical aspects: This EEG study was done with scalp electrodes positioned according to the 10-20 International system of electrode placement. Electrical activity was reviewed with band pass filter of 1-70Hz , sensitivity of 7 uV/mm, display speed of 70mm/sec with a 60Hz  notched filter applied as appropriate. EEG data were recorded continuously and digitally stored.  Video monitoring was available and reviewed as appropriate.  Description: At the beginning of the study, EEG showed continuous generalized 5 to 6 Hz theta slowing with overriding 15 to 18 Hz beta activity.  As sedation was weaned, EEG showed posterior dominant rhythm of 9 Hz activity of moderate voltage (25-35 uV) seen predominantly in posterior head regions, symmetric and reactive to eye opening and eye closing. Sleep was characterized by vertex waves, sleep spindles (12 to 14 Hz), maximal frontocentral region. Hyperventilation and photic stimulation were not performed.     ABNORMALITY - Continuous slow, generalized  IMPRESSION: This study was initially suggestive of severe diffuse encephalopathy, likely related to sedation.  As sedation was weaned, EEG improved and was within normal limits.  No seizures or epileptiform discharges were seen throughout the recording.  A normal interictal EEG does not exclude the diagnosis of epilepsy.  [name]

## 2022-07-14 NOTE — Discharge Summary (Signed)
Patient admitted earlier in the day for AMS/unresponsiveness, CT head and UDS negative.  Presumed seizures with seizure activity documented by ED.  Patient was intubated for airway protection and placed on LTM EEG monitoring for capturing of further seizures.  Around 1500, patient EEG was reviewed and negative for seizure activity and propofol was weaned to off.  Upon weaning of propofol, patient became alert and appeared to be oriented with hand gestures, mouthing words and attempting to type words on his phone to communicate.  He was able to nod appropriately to questions and follow all commands even on Precedex 2.0 and fentanyl 200.  Sedation was discontinued and patient remained appropriate from a mental status standpoint and was subsequently extubated at 2011.   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  His wife, at bedside, disagreed with this decision but ultimately stated that it was up to the patient.  Patient passed bedside swallow evaluation with RN, Sam, and Foley catheter was removed.  I spoke with Dr. Wilford Corner (on-call for Neurology) who echoed our concerns with patient leaving, but ultimately agreed that if he was back to his mental status baseline that he could sign out AGAINST MEDICAL ADVICE.  Dr. Wilford Corner advised new medication of Keppra 500 mg twice daily; 1 dose of this medication was ordered for this evening prior to patient leaving the hospital to ensure no gaps in coverage and prescription was sent to patient's home [facility] in [place].  Dr. Wilford Corner additionally provided me with seizure precaution information to provide to patient; this was printed and given to/reviewed with patient and a signed acknowledgment of this information put in patient's chart.     Patient subsequently signed AMA paperwork.  Patient plans to leave the hospital after EEG leads have been removed.

## 2022-07-15 LAB — CULTURE, BLOOD (ROUTINE X 2)

## 2022-07-17 LAB — CULTURE, BLOOD (ROUTINE X 2)

## 2022-07-18 LAB — CULTURE, BLOOD (ROUTINE X 2)
Culture: NO GROWTH
Special Requests: ADEQUATE

## 2023-02-09 ENCOUNTER — Other Ambulatory Visit: Payer: Self-pay

## 2023-02-09 ENCOUNTER — Emergency Department (HOSPITAL_COMMUNITY)
Admission: EM | Admit: 2023-02-09 | Discharge: 2023-02-09 | Disposition: A | Discharge status: Home / Self Care | Payer: Medicaid Other | Attending: Emergency Medicine | Admitting: Emergency Medicine

## 2023-02-09 ENCOUNTER — Emergency Department (HOSPITAL_COMMUNITY): Payer: Medicaid Other

## 2023-02-09 DIAGNOSIS — J45909 Unspecified asthma, uncomplicated: Secondary | ICD-10-CM | POA: Insufficient documentation

## 2023-02-09 DIAGNOSIS — I10 Essential (primary) hypertension: Secondary | ICD-10-CM | POA: Insufficient documentation

## 2023-02-09 DIAGNOSIS — Y907 Blood alcohol level of 200-239 mg/100 ml: Secondary | ICD-10-CM | POA: Diagnosis not present

## 2023-02-09 DIAGNOSIS — R4182 Altered mental status, unspecified: Secondary | ICD-10-CM | POA: Diagnosis present

## 2023-02-09 DIAGNOSIS — Z79899 Other long term (current) drug therapy: Secondary | ICD-10-CM | POA: Insufficient documentation

## 2023-02-09 DIAGNOSIS — F1090 Alcohol use, unspecified, uncomplicated: Secondary | ICD-10-CM | POA: Diagnosis not present

## 2023-02-09 DIAGNOSIS — F10920 Alcohol use, unspecified with intoxication, uncomplicated: Secondary | ICD-10-CM

## 2023-02-09 LAB — COMPREHENSIVE METABOLIC PANEL
ALT: 74 U/L — ABNORMAL HIGH (ref 0–44)
AST: 51 U/L — ABNORMAL HIGH (ref 15–41)
Albumin: 5 g/dL (ref 3.5–5.0)
Alkaline Phosphatase: 68 U/L (ref 38–126)
Anion gap: 10 (ref 5–15)
BUN: 15 mg/dL (ref 6–20)
CO2: 24 mmol/L (ref 22–32)
Calcium: 9 mg/dL (ref 8.9–10.3)
Chloride: 106 mmol/L (ref 98–111)
Creatinine, Ser: 0.74 mg/dL (ref 0.61–1.24)
GFR, Estimated: >60 mL/min (ref >60)
Glucose, Bld: 107 mg/dL — ABNORMAL HIGH (ref 70–99)
Potassium: 3.7 mmol/L (ref 3.5–5.1)
Sodium: 140 mmol/L (ref 135–145)
Total Bilirubin: 0.6 mg/dL (ref <1.2)
Total Protein: 8.1 g/dL (ref 6.5–8.1)

## 2023-02-09 LAB — CBC WITH DIFFERENTIAL/PLATELET
Abs Immature Granulocytes: 0.05 10*3/uL (ref 0.00–0.07)
Basophils Absolute: 0.1 10*3/uL (ref 0.0–0.1)
Basophils Relative: 1 %
Eosinophils Absolute: 0.1 10*3/uL (ref 0.0–0.5)
Eosinophils Relative: 1 %
HCT: 44.3 % (ref 39.0–52.0)
Hemoglobin: 15.9 g/dL (ref 13.0–17.0)
Immature Granulocytes: 1 %
Lymphocytes Relative: 47 %
Lymphs Abs: 4.5 10*3/uL — ABNORMAL HIGH (ref 0.7–4.0)
MCH: 33.8 pg (ref 26.0–34.0)
MCHC: 35.9 g/dL (ref 30.0–36.0)
MCV: 94.3 fL (ref 80.0–100.0)
Monocytes Absolute: 0.7 10*3/uL (ref 0.1–1.0)
Monocytes Relative: 8 %
Neutro Abs: 4 10*3/uL (ref 1.7–7.7)
Neutrophils Relative %: 42 %
Platelets: 193 10*3/uL (ref 150–400)
RBC: 4.7 MIL/uL (ref 4.22–5.81)
RDW: 11.5 % (ref 11.5–15.5)
WBC: 9.4 10*3/uL (ref 4.0–10.5)
nRBC: 0 % (ref 0.0–0.2)

## 2023-02-09 LAB — AMMONIA: Ammonia: 41 umol/L — ABNORMAL HIGH (ref 9–35)

## 2023-02-09 LAB — RAPID URINE DRUG SCREEN, HOSP PERFORMED
Amphetamines: NOT DETECTED
Barbiturates: NOT DETECTED
Benzodiazepines: POSITIVE — AB
Cocaine: NOT DETECTED
Opiates: NOT DETECTED
Tetrahydrocannabinol: POSITIVE — AB

## 2023-02-09 LAB — ETHANOL: Alcohol, Ethyl (B): 238 mg/dL — ABNORMAL HIGH (ref <10)

## 2023-02-09 LAB — CBG MONITORING, ED: Glucose-Capillary: 97 mg/dL (ref 70–99)

## 2023-02-09 LAB — LACTIC ACID, PLASMA
Lactic Acid, Venous: 2.4 mmol/L (ref 0.5–1.9)
Lactic Acid, Venous: 2.2 mmol/L (ref 0.5–1.9)

## 2023-02-09 MED ORDER — NALOXONE HCL 0.4 MG/ML IJ SOLN
0.4000 mg | Freq: Once | INTRAMUSCULAR | Status: AC
  Administered 2023-02-09: 0.4 mg via INTRAVENOUS
  Filled 2023-02-09: qty 1

## 2023-02-09 MED ORDER — AMMONIA AROMATIC IN INHA
RESPIRATORY_TRACT | Status: AC
  Filled 2023-02-09: qty 10

## 2023-02-09 NOTE — ED Notes (Signed)
Patient SpO2 86% on room air. Upon inspection, patient has snoring respirations. Patient HOB elevated and patient placed on 4L via [place] with improvements in SpO2

## 2023-02-09 NOTE — ED Notes (Signed)
Patient transported to CT 

## 2023-02-09 NOTE — ED Notes (Signed)
Pt awake, sat head of bed up. Pt off of oxygen and SpO2 97% on RA. Pt given crackers and sprite and is eating and drinking at this time. Will ambulate after pt finishes snack--MD made aware

## 2023-02-09 NOTE — ED Provider Notes (Signed)
Signed out earlier that pt with acute alcohol intoxication, possible also with med side effect/polypharmacy, and with similar prior presentation, and that once metabolizes to plan for d/c to home.   Pt observed in ED, several rechecks, resting, easily aroused, no new c/o.   Po fluids, food. Pt ambulates in hall, steady gait. Significant other/responsible adult here with patient.  Pt indicates to RN feels fine and ready for d/c.   Pt currently appears stable for d/c per earlier EDP plan.   Return precautions provided.    Cathren Laine, MD 02/09/23 1537

## 2023-02-09 NOTE — Discharge Instructions (Addendum)
It was our pleasure to provide your ER care today - we hope that you feel better.  Drink plenty of fluids/stay well hydrated.   Avoid excessive alcohol use, and make sure to never mix alcohol with other substances, or with sedating medications.   Follow up closely with primary care doctor in the coming week.  Return to ER if worse, new symptoms, fevers, chest pain, trouble breathing, severe abdominal pain, persistent vomiting, weak/fainting, or other concern.

## 2023-02-09 NOTE — ED Notes (Signed)
Pt walked to the nursing station with no issues, stated "they wanted me to walk, I'm walking, I feel fine, I want to go home". MD notified.

## 2023-02-09 NOTE — ED Notes (Signed)
Attempted to ambulate pt, pt was able to stand for a couple of seconds but had to sit back down as he felt "nauseous and dizzy"--MD made aware

## 2023-02-09 NOTE — ED Notes (Signed)
Pt is sleeping at this time, water given to spouse at bedside and informed when pt wakes up encourage/offer water to pt to drink

## 2023-02-09 NOTE — ED Notes (Signed)
Patient has intermittent periods of alertness. Has opened eyes spontaneously multiple times; at one point was asking questions and having short conversation

## 2023-02-09 NOTE — ED Triage Notes (Signed)
Patient from home for altered mental status. Patient found by family on his knees against his bed, "making a weird noise". Family provided CPR prior to EMS arrival. EMS reports patient was on the ground on their arrival. EMS attempted to sternal rub patient and use a ammonia inhalant with no response. Last known normal approx 1-2 hours ago. Family reports to EMS that patient has been drinking tonight, denies drug use but does have history of drug use. Upon arrival to ER, patient is not responsive; will flutter eyelids, no purposeful movements

## 2023-02-09 NOTE — ED Notes (Signed)
Able to tolerate crackers and sprite

## 2023-02-09 NOTE — ED Provider Notes (Signed)
EMERGENCY DEPARTMENT AT [facility] Provider Note   CSN: [id number] Arrival date & time: 02/09/23  6283     History  Chief Complaint  Patient presents with   Altered Mental Status    Larry Rojas is a 37 y.o. male.  HPI     This is a 37 year old male who presents with altered mental status.  He was found by his family on his knees near the bed making a weird noise.  Per EMS family administered CPR.  Patient was on the ground prior to EMS arrival.  He has been minimally responsive.  Last seen normal 1 to 2 hours prior to EMS arrival.  Positive EtOH.  Has a history of drug use but no noted use tonight per family.  Per EMS vital signs stable en route.  Home Medications Prior to Admission medications   Medication Sig Start Date End Date Taking? Authorizing Provider  amLODipine (NORVASC) 10 MG tablet Take 10 mg by mouth daily.    [provider]  clonazePAM (KLONOPIN) 0.5 MG tablet Take 0.5 mg by mouth 2 (two) times daily as needed for anxiety. 05/31/20   [provider]  gabapentin (NEURONTIN) 300 MG capsule Take 300 mg by mouth 2 (two) times daily.    [provider]  levETIRAcetam (KEPPRA) 500 MG tablet Take 1 tablet (500 mg total) by mouth 2 (two) times daily. 07/14/22   Tim Lair, PA-C  levothyroxine (SYNTHROID) 25 MCG tablet Take 25 mcg by mouth daily. 02/11/21   [provider]  MELATONIN PO Take 1 tablet by mouth at bedtime as needed (Sleep).    [provider]  omeprazole (PRILOSEC) 20 MG capsule Take 20 mg by mouth daily as needed (Acid reflux).    [provider]  tiZANidine (ZANAFLEX) 4 MG tablet Take 4 mg by mouth every 6 (six) hours as needed for muscle spasms. 07/03/22   [provider]      Allergies    Fluoxetine, Trazodone and nefazodone, and Hydrocodone    Review of Systems   Review of Systems  Unable to perform ROS: Mental status change    Physical Exam Updated  Vital Signs BP (!) 146/85   Pulse 90   Temp 98.2 F (36.8 C) (Oral)   Resp 17   Ht 1.88 m (6\' 2" )   Wt 109.7 kg   SpO2 100%   BMI 31.05 kg/m  Physical Exam Vitals and nursing note reviewed.  Constitutional:      Appearance: He is well-developed. He is obese. He is not ill-appearing.  HENT:     Head: Normocephalic and atraumatic.  Eyes:     Pupils: Pupils are equal, round, and reactive to light.     Comments: Pupils 8 and reactive bilaterally  Cardiovascular:     Rate and Rhythm: Normal rate and regular rhythm.     Heart sounds: Normal heart sounds. No murmur heard. Pulmonary:     Effort: Pulmonary effort is normal. No respiratory distress.     Breath sounds: Normal breath sounds. No wheezing.  Abdominal:     General: Bowel sounds are normal.     Palpations: Abdomen is soft.     Tenderness: There is no abdominal tenderness. There is no rebound.  Musculoskeletal:     Cervical back: Neck supple.  Lymphadenopathy:     Cervical: No cervical adenopathy.  Skin:    General: Skin is warm and dry.  Neurological:     Mental Status:  He is alert.     Comments: Minimally arousable, occasionally will spontaneously move or startle but tolerated nasal trumpet and did not withdrawal to painful stimuli on any extremity, during periods of startling he appears to move all 4 extremities spontaneously  Psychiatric:     Comments: Unable to assess     ED Results / Procedures / Treatments   Labs (all labs ordered are listed, but only abnormal results are displayed) Labs Reviewed  CBC WITH DIFFERENTIAL/PLATELET - Abnormal; Notable for the following components:      Result Value   Lymphs Abs 4.5 (*)    All other components within normal limits  COMPREHENSIVE METABOLIC PANEL - Abnormal; Notable for the following components:   Glucose, Bld 107 (*)    AST 51 (*)    ALT 74 (*)    All other components within normal limits  ETHANOL - Abnormal; Notable for the following components:   Alcohol,  Ethyl (B) 238 (*)    All other components within normal limits  AMMONIA - Abnormal; Notable for the following components:   Ammonia 41 (*)    All other components within normal limits  LACTIC ACID, PLASMA - Abnormal; Notable for the following components:   Lactic Acid, Venous 2.4 (*)    All other components within normal limits  RAPID URINE DRUG SCREEN, HOSP PERFORMED  LACTIC ACID, PLASMA  CBG MONITORING, ED    EKG EKG Interpretation Date/Time:  Friday February 09 2023 05:11:22 EST Ventricular Rate:  91 PR Interval:  141 QRS Duration:  110 QT Interval:  369 QTC Calculation: 454 R Axis:   58  Text Interpretation: Sinus rhythm RSR' in V1 or V2, right VCD or RVH Borderline T abnormalities, inferior leads Confirmed by Ross Marcus ([id number]) on 02/09/2023 6:12:08 AM  Radiology CT Head Wo Contrast Result Date: 02/09/2023 CLINICAL DATA:  37 year old male with altered mental status. EXAM: CT HEAD WITHOUT CONTRAST TECHNIQUE: Contiguous axial images were obtained from the base of the skull through the vertex without intravenous contrast. RADIATION DOSE REDUCTION: This exam was performed according to the departmental dose-optimization program which includes automated exposure control, adjustment of the mA and/or kV according to patient size and/or use of iterative reconstruction technique. COMPARISON:  Brain MRI and Head CT 07/13/2022. FINDINGS: Brain: Cerebral volume is stable, normal. No midline shift, ventriculomegaly, mass effect, evidence of mass lesion, intracranial hemorrhage or evidence of cortically based acute infarction. Gray-white matter differentiation is within normal limits throughout the brain. Vascular: No suspicious intracranial vascular hyperdensity. Skull: Intact, negative. Sinuses/Orbits: Visualized paranasal sinuses and mastoids are well aerated. Other: No acute orbit or scalp soft tissue finding. IMPRESSION: Stable, normal noncontrast CT appearance of the brain.  Electronically Signed   By: Odessa Fleming M.D.   On: 02/09/2023 06:10    Procedures .Critical Care  Performed by: Shon Baton, MD Authorized by: Shon Baton, MD   Critical care provider statement:    Critical care time (minutes):  31   Critical care was necessary to treat or prevent imminent or life-threatening deterioration of the following conditions: Acute intoxication, multiple rechecks.   Critical care was time spent personally by me on the following activities:  Development of treatment plan with patient or surrogate, discussions with consultants, evaluation of patient's response to treatment, examination of patient, ordering and review of laboratory studies, ordering and review of radiographic studies, ordering and performing treatments and interventions, pulse oximetry, re-evaluation of patient's condition and review of old charts  Medications Ordered in ED Medications  ammonia inhalant (  Given 02/09/23 0520)  naloxone [facility]) injection 0.4 mg (0.4 mg Intravenous Given 02/09/23 0533)    ED Course/ Medical Decision Making/ A&P Clinical Course as of 02/09/23 0651  Fri Feb 09, 2023  7829 Chart reviewed.  Patient had similar presentation to the emergency room in May who had some seizure-like activity.  EEG did not show any evidence of seizures and patient upon awaking left AGAINST MEDICAL ADVICE.  Of note, alcohol level at that admission was greater than 200 as well. [CH]  [phone] Patient remains somnolent.  Protecting airway. [CH]    Clinical Course User Index [CH] Amonie Wisser, Mayer Masker, MD                                 Medical Decision Making Amount and/or Complexity of Data Reviewed Labs: ordered. Radiology: ordered.  Risk Prescription drug management.   This patient presents to the ED for concern of altered mental status, this involves an extensive number of treatment options, and is a complaint that carries with it a high risk of complications and morbidity.   I considered the following differential and admission for this acute, potentially life threatening condition.  The differential diagnosis includes intoxication, polypharmacy, illicit drug use, head injury, brain bleed  MDM:    This is a 37 year old male who presents with altered mental status.  He is minimally arousable on evaluation.  He ultimately did respond to nasal trumpet.  However he quickly went back to sleep and did not provide any history.  Has had a similar presentation in May of this year when he was intubated for possible seizure-like activity after this presentation.  He left AGAINST MEDICAL ADVICE.  Blood alcohol level today is 238.  (Labs, imaging, consults)  Labs: I Ordered, and personally interpreted labs.  The pertinent results include: CBC, CMP, ammonia, EtOH, UDS  Imaging Studies ordered: I ordered imaging studies including CT head I independently visualized and interpreted imaging. I agree with the radiologist interpretation  Additional history obtained from EMS.  External records from outside source obtained and reviewed including recent discharge summary  Cardiac Monitoring: The patient was maintained on a cardiac monitor.  If on the cardiac monitor, I personally viewed and interpreted the cardiac monitored which showed an underlying rhythm of: Sinus rhythm  Reevaluation: After the interventions noted above, I reevaluated the patient and found that they have :stayed the same  Social Determinants of Health:  history of alcohol intoxication, substance abuse  Disposition: Pending  Co morbidities that complicate the patient evaluation  Past Medical History:  Diagnosis Date   Asthma    Back pain    Borderline diabetic    Diverticulitis 08/23/2021   Hypertension    Thyroid disease      Medicines Meds ordered this encounter  Medications   ammonia inhalant    Waylan Boga G: cabinet override   naloxone [facility]) injection 0.4 mg    I have reviewed the  patients home medicines and have made adjustments as needed  Problem List / ED Course: Problem List Items Addressed This Visit   None               Final Clinical Impression(s) / ED Diagnoses Final diagnoses:  None    Rx / DC Orders ED Discharge Orders     None         Wilian Kwong, Mayer Masker,  MD 02/09/23 [phone]

## 2023-02-16 ENCOUNTER — Inpatient Hospital Stay (HOSPITAL_COMMUNITY)
Admission: EM | Admit: 2023-02-16 | Discharge: 2023-02-18 | DRG: 871 | Disposition: A | Discharge status: Home / Self Care | Payer: Medicaid Other | Attending: Internal Medicine | Admitting: Internal Medicine

## 2023-02-16 ENCOUNTER — Other Ambulatory Visit: Payer: Self-pay

## 2023-02-16 ENCOUNTER — Emergency Department (HOSPITAL_COMMUNITY): Payer: Medicaid Other

## 2023-02-16 ENCOUNTER — Encounter (HOSPITAL_COMMUNITY): Payer: Self-pay | Admitting: *Deleted

## 2023-02-16 DIAGNOSIS — Z888 Allergy status to other drugs, medicaments and biological substances status: Secondary | ICD-10-CM

## 2023-02-16 DIAGNOSIS — G8929 Other chronic pain: Secondary | ICD-10-CM | POA: Diagnosis present

## 2023-02-16 DIAGNOSIS — R6520 Severe sepsis without septic shock: Secondary | ICD-10-CM | POA: Diagnosis present

## 2023-02-16 DIAGNOSIS — A409 Streptococcal sepsis, unspecified: Secondary | ICD-10-CM

## 2023-02-16 DIAGNOSIS — Z79890 Hormone replacement therapy (postmenopausal): Secondary | ICD-10-CM

## 2023-02-16 DIAGNOSIS — J45909 Unspecified asthma, uncomplicated: Secondary | ICD-10-CM | POA: Diagnosis present

## 2023-02-16 DIAGNOSIS — E785 Hyperlipidemia, unspecified: Secondary | ICD-10-CM | POA: Diagnosis present

## 2023-02-16 DIAGNOSIS — A419 Sepsis, unspecified organism: Principal | ICD-10-CM | POA: Diagnosis present

## 2023-02-16 DIAGNOSIS — Z885 Allergy status to narcotic agent status: Secondary | ICD-10-CM

## 2023-02-16 DIAGNOSIS — M4302 Spondylolysis, cervical region: Secondary | ICD-10-CM | POA: Diagnosis present

## 2023-02-16 DIAGNOSIS — I10 Essential (primary) hypertension: Secondary | ICD-10-CM | POA: Diagnosis present

## 2023-02-16 DIAGNOSIS — E872 Acidosis, unspecified: Secondary | ICD-10-CM | POA: Diagnosis present

## 2023-02-16 DIAGNOSIS — E039 Hypothyroidism, unspecified: Secondary | ICD-10-CM | POA: Diagnosis present

## 2023-02-16 DIAGNOSIS — J189 Pneumonia, unspecified organism: Secondary | ICD-10-CM | POA: Diagnosis not present

## 2023-02-16 DIAGNOSIS — F1010 Alcohol abuse, uncomplicated: Secondary | ICD-10-CM | POA: Diagnosis present

## 2023-02-16 DIAGNOSIS — R569 Unspecified convulsions: Secondary | ICD-10-CM | POA: Diagnosis present

## 2023-02-16 DIAGNOSIS — Z6831 Body mass index (BMI) 31.0-31.9, adult: Secondary | ICD-10-CM

## 2023-02-16 DIAGNOSIS — E66811 Obesity, class 1: Secondary | ICD-10-CM | POA: Diagnosis present

## 2023-02-16 DIAGNOSIS — K709 Alcoholic liver disease, unspecified: Secondary | ICD-10-CM | POA: Diagnosis present

## 2023-02-16 DIAGNOSIS — Z79899 Other long term (current) drug therapy: Secondary | ICD-10-CM | POA: Diagnosis not present

## 2023-02-16 DIAGNOSIS — Z791 Long term (current) use of non-steroidal anti-inflammatories (NSAID): Secondary | ICD-10-CM

## 2023-02-16 DIAGNOSIS — J690 Pneumonitis due to inhalation of food and vomit: Secondary | ICD-10-CM | POA: Diagnosis present

## 2023-02-16 DIAGNOSIS — E7220 Disorder of urea cycle metabolism, unspecified: Secondary | ICD-10-CM | POA: Diagnosis present

## 2023-02-16 DIAGNOSIS — Z1152 Encounter for screening for COVID-19: Secondary | ICD-10-CM

## 2023-02-16 DIAGNOSIS — K219 Gastro-esophageal reflux disease without esophagitis: Secondary | ICD-10-CM | POA: Diagnosis present

## 2023-02-16 LAB — URINALYSIS, W/ REFLEX TO CULTURE (INFECTION SUSPECTED)
Bacteria, UA: NONE SEEN
Bilirubin Urine: NEGATIVE
Glucose, UA: NEGATIVE mg/dL
Hgb urine dipstick: NEGATIVE
Ketones, ur: 5 mg/dL — AB
Leukocytes,Ua: NEGATIVE
Nitrite: NEGATIVE
Protein, ur: NEGATIVE mg/dL
Specific Gravity, Urine: 1.017 (ref 1.005–1.030)
pH: 6 (ref 5.0–8.0)

## 2023-02-16 LAB — CBC WITH DIFFERENTIAL/PLATELET
Abs Immature Granulocytes: 0.04 10*3/uL (ref 0.00–0.07)
Basophils Absolute: 0 10*3/uL (ref 0.0–0.1)
Basophils Relative: 0 %
Eosinophils Absolute: 0.1 10*3/uL (ref 0.0–0.5)
Eosinophils Relative: 1 %
HCT: 38.8 % — ABNORMAL LOW (ref 39.0–52.0)
Hemoglobin: 13.7 g/dL (ref 13.0–17.0)
Immature Granulocytes: 0 %
Lymphocytes Relative: 12 %
Lymphs Abs: 1.3 10*3/uL (ref 0.7–4.0)
MCH: 33.8 pg (ref 26.0–34.0)
MCHC: 35.3 g/dL (ref 30.0–36.0)
MCV: 95.8 fL (ref 80.0–100.0)
Monocytes Absolute: 0.5 10*3/uL (ref 0.1–1.0)
Monocytes Relative: 5 %
Neutro Abs: 8.7 10*3/uL — ABNORMAL HIGH (ref 1.7–7.7)
Neutrophils Relative %: 82 %
Platelets: 145 10*3/uL — ABNORMAL LOW (ref 150–400)
RBC: 4.05 MIL/uL — ABNORMAL LOW (ref 4.22–5.81)
RDW: 11.5 % (ref 11.5–15.5)
WBC: 10.6 10*3/uL — ABNORMAL HIGH (ref 4.0–10.5)
nRBC: 0 % (ref 0.0–0.2)

## 2023-02-16 LAB — COMPREHENSIVE METABOLIC PANEL
ALT: 81 U/L — ABNORMAL HIGH (ref 0–44)
AST: 79 U/L — ABNORMAL HIGH (ref 15–41)
Albumin: 3.8 g/dL (ref 3.5–5.0)
Alkaline Phosphatase: 54 U/L (ref 38–126)
Anion gap: 10 (ref 5–15)
BUN: 15 mg/dL (ref 6–20)
CO2: 23 mmol/L (ref 22–32)
Calcium: 8.4 mg/dL — ABNORMAL LOW (ref 8.9–10.3)
Chloride: 105 mmol/L (ref 98–111)
Creatinine, Ser: 0.86 mg/dL (ref 0.61–1.24)
GFR, Estimated: >60 mL/min (ref >60)
Glucose, Bld: 145 mg/dL — ABNORMAL HIGH (ref 70–99)
Potassium: 3.6 mmol/L (ref 3.5–5.1)
Sodium: 138 mmol/L (ref 135–145)
Total Bilirubin: 1.4 mg/dL — ABNORMAL HIGH (ref 0.0–1.2)
Total Protein: 6.4 g/dL — ABNORMAL LOW (ref 6.5–8.1)

## 2023-02-16 LAB — LACTIC ACID, PLASMA
Lactic Acid, Venous: 2.5 mmol/L (ref 0.5–1.9)
Lactic Acid, Venous: 2 mmol/L (ref 0.5–1.9)

## 2023-02-16 LAB — RESP PANEL BY RT-PCR (RSV, FLU A&B, COVID)  RVPGX2
Influenza A by PCR: NEGATIVE
Influenza B by PCR: NEGATIVE
Resp Syncytial Virus by PCR: NEGATIVE
SARS Coronavirus 2 by RT PCR: NEGATIVE

## 2023-02-16 LAB — TROPONIN I (HIGH SENSITIVITY)
Troponin I (High Sensitivity): 5 ng/L (ref <18)
Troponin I (High Sensitivity): 6 ng/L (ref <18)

## 2023-02-16 LAB — APTT: aPTT: 28 s (ref 24–36)

## 2023-02-16 LAB — GLUCOSE, CAPILLARY: Glucose-Capillary: 253 mg/dL — ABNORMAL HIGH (ref 70–99)

## 2023-02-16 LAB — PROCALCITONIN: Procalcitonin: 0.49 ng/mL

## 2023-02-16 LAB — PROTIME-INR
INR: 1 (ref 0.8–1.2)
Prothrombin Time: 13.8 s (ref 11.4–15.2)

## 2023-02-16 LAB — MAGNESIUM: Magnesium: 1.7 mg/dL (ref 1.7–2.4)

## 2023-02-16 LAB — STREP PNEUMONIAE URINARY ANTIGEN: Strep Pneumo Urinary Antigen: NEGATIVE

## 2023-02-16 LAB — PHOSPHORUS: Phosphorus: 1.2 mg/dL — ABNORMAL LOW (ref 2.5–4.6)

## 2023-02-16 MED ORDER — ALBUTEROL SULFATE (2.5 MG/3ML) 0.083% IN NEBU: 2.5000 mg | INHALATION_SOLUTION | RESPIRATORY_TRACT | Status: DC | PRN

## 2023-02-16 MED ORDER — THIAMINE HCL 100 MG/ML IJ SOLN
100.0000 mg | Freq: Every day | INTRAMUSCULAR | Status: DC
  Filled 2023-02-16: qty 2

## 2023-02-16 MED ORDER — OXYCODONE HCL 5 MG PO TABS
5.0000 mg | ORAL_TABLET | Freq: Once | ORAL | Status: AC
  Administered 2023-02-16: 5 mg via ORAL
  Filled 2023-02-16: qty 1

## 2023-02-16 MED ORDER — FOLIC ACID 1 MG PO TABS
1.0000 mg | ORAL_TABLET | Freq: Every day | ORAL | Status: DC
  Administered 2023-02-16 – 2023-02-18 (×3): 1 mg via ORAL
  Filled 2023-02-16 (×3): qty 1

## 2023-02-16 MED ORDER — PANTOPRAZOLE SODIUM 40 MG PO TBEC
40.0000 mg | DELAYED_RELEASE_TABLET | Freq: Every day | ORAL | Status: DC
  Administered 2023-02-16 – 2023-02-18 (×3): 40 mg via ORAL
  Filled 2023-02-16 (×3): qty 1

## 2023-02-16 MED ORDER — SODIUM CHLORIDE 0.9 % IV SOLN
500.0000 mg | INTRAVENOUS | Status: DC
  Administered 2023-02-16 – 2023-02-17 (×2): 500 mg via INTRAVENOUS
  Filled 2023-02-16 (×2): qty 5

## 2023-02-16 MED ORDER — DOXYCYCLINE HYCLATE 100 MG PO TABS
100.0000 mg | ORAL_TABLET | Freq: Once | ORAL | Status: AC
  Administered 2023-02-16: 100 mg via ORAL
  Filled 2023-02-16: qty 1

## 2023-02-16 MED ORDER — LEVETIRACETAM 500 MG PO TABS
500.0000 mg | ORAL_TABLET | Freq: Two times a day (BID) | ORAL | Status: DC
  Administered 2023-02-16 – 2023-02-18 (×4): 500 mg via ORAL
  Filled 2023-02-16 (×4): qty 1

## 2023-02-16 MED ORDER — ALPRAZOLAM 0.5 MG PO TABS
0.5000 mg | ORAL_TABLET | Freq: Once | ORAL | Status: AC
  Administered 2023-02-16: 0.5 mg via ORAL
  Filled 2023-02-16: qty 1

## 2023-02-16 MED ORDER — HYDRALAZINE HCL 20 MG/ML IJ SOLN: 5.0000 mg | INTRAMUSCULAR | Status: DC | PRN

## 2023-02-16 MED ORDER — ADULT MULTIVITAMIN W/MINERALS CH
1.0000 | ORAL_TABLET | Freq: Every day | ORAL | Status: DC
  Administered 2023-02-16 – 2023-02-18 (×3): 1 via ORAL
  Filled 2023-02-16 (×3): qty 1

## 2023-02-16 MED ORDER — ALPRAZOLAM 1 MG PO TABS
1.0000 mg | ORAL_TABLET | Freq: Two times a day (BID) | ORAL | Status: DC
  Administered 2023-02-17 – 2023-02-18 (×3): 1 mg via ORAL
  Filled 2023-02-16 (×3): qty 1

## 2023-02-16 MED ORDER — OXYCODONE HCL 5 MG PO TABS
5.0000 mg | ORAL_TABLET | Freq: Four times a day (QID) | ORAL | Status: DC | PRN
  Administered 2023-02-16 – 2023-02-18 (×5): 5 mg via ORAL
  Filled 2023-02-16 (×5): qty 1

## 2023-02-16 MED ORDER — ACETAMINOPHEN 325 MG PO TABS: 650.0000 mg | ORAL_TABLET | Freq: Once | ORAL | Status: AC | PRN

## 2023-02-16 MED ORDER — LORAZEPAM 1 MG PO TABS
1.0000 mg | ORAL_TABLET | ORAL | Status: DC | PRN
  Administered 2023-02-16: 1 mg via ORAL
  Filled 2023-02-16: qty 1

## 2023-02-16 MED ORDER — ACETAMINOPHEN 325 MG PO TABS
ORAL_TABLET | ORAL | Status: AC
  Administered 2023-02-16: 650 mg via ORAL
  Filled 2023-02-16: qty 2

## 2023-02-16 MED ORDER — LORAZEPAM 2 MG/ML IJ SOLN
1.0000 mg | INTRAMUSCULAR | Status: DC | PRN
  Administered 2023-02-16 – 2023-02-18 (×5): 2 mg via INTRAVENOUS
  Filled 2023-02-16 (×5): qty 1

## 2023-02-16 MED ORDER — ENOXAPARIN SODIUM 40 MG/0.4ML IJ SOSY
40.0000 mg | PREFILLED_SYRINGE | INTRAMUSCULAR | Status: DC
  Administered 2023-02-16 – 2023-02-17 (×2): 40 mg via SUBCUTANEOUS
  Filled 2023-02-16 (×2): qty 0.4

## 2023-02-16 MED ORDER — LEVOTHYROXINE SODIUM 50 MCG PO TABS
25.0000 ug | ORAL_TABLET | Freq: Every day | ORAL | Status: DC
  Administered 2023-02-16 – 2023-02-17 (×2): 25 ug via ORAL
  Filled 2023-02-16 (×3): qty 1

## 2023-02-16 MED ORDER — GABAPENTIN 300 MG PO CAPS
300.0000 mg | ORAL_CAPSULE | Freq: Two times a day (BID) | ORAL | Status: DC
  Administered 2023-02-16 – 2023-02-18 (×4): 300 mg via ORAL
  Filled 2023-02-16 (×4): qty 1

## 2023-02-16 MED ORDER — TIZANIDINE HCL 4 MG PO TABS
4.0000 mg | ORAL_TABLET | Freq: Every evening | ORAL | Status: DC | PRN
  Administered 2023-02-16 – 2023-02-17 (×2): 4 mg via ORAL
  Filled 2023-02-16 (×2): qty 1

## 2023-02-16 MED ORDER — SODIUM CHLORIDE 0.9 % IV BOLUS (SEPSIS)
1000.0000 mL | Freq: Once | INTRAVENOUS | Status: AC
  Administered 2023-02-16: 1000 mL via INTRAVENOUS

## 2023-02-16 MED ORDER — KETOROLAC TROMETHAMINE 15 MG/ML IJ SOLN
15.0000 mg | Freq: Once | INTRAMUSCULAR | Status: AC
  Administered 2023-02-16: 15 mg via INTRAVENOUS
  Filled 2023-02-16: qty 1

## 2023-02-16 MED ORDER — MELOXICAM 7.5 MG PO TABS
15.0000 mg | ORAL_TABLET | Freq: Every day | ORAL | Status: DC
  Administered 2023-02-17 – 2023-02-18 (×2): 15 mg via ORAL
  Filled 2023-02-16: qty 1
  Filled 2023-02-16 (×2): qty 2
  Filled 2023-02-16 (×2): qty 1

## 2023-02-16 MED ORDER — AMLODIPINE BESYLATE 5 MG PO TABS
10.0000 mg | ORAL_TABLET | Freq: Every day | ORAL | Status: DC
  Administered 2023-02-16 – 2023-02-17 (×2): 10 mg via ORAL
  Filled 2023-02-16 (×2): qty 2

## 2023-02-16 MED ORDER — EZETIMIBE 10 MG PO TABS
10.0000 mg | ORAL_TABLET | Freq: Every day | ORAL | Status: DC
  Administered 2023-02-16 – 2023-02-17 (×2): 10 mg via ORAL
  Filled 2023-02-16 (×2): qty 1

## 2023-02-16 MED ORDER — POLYETHYLENE GLYCOL 3350 17 G PO PACK: 17.0000 g | PACK | Freq: Every day | ORAL | Status: DC | PRN

## 2023-02-16 MED ORDER — THIAMINE MONONITRATE 100 MG PO TABS
100.0000 mg | ORAL_TABLET | Freq: Every day | ORAL | Status: DC
  Administered 2023-02-16 – 2023-02-18 (×3): 100 mg via ORAL
  Filled 2023-02-16 (×3): qty 1

## 2023-02-16 MED ORDER — SODIUM CHLORIDE 0.9 % IV SOLN
1.0000 g | Freq: Once | INTRAVENOUS | Status: AC
  Administered 2023-02-16: 1 g via INTRAVENOUS
  Filled 2023-02-16: qty 10

## 2023-02-16 MED ORDER — NIACIN ER (ANTIHYPERLIPIDEMIC) 500 MG PO TBCR
500.0000 mg | EXTENDED_RELEASE_TABLET | Freq: Every day | ORAL | Status: DC
  Filled 2023-02-16 (×3): qty 1

## 2023-02-16 MED ORDER — SODIUM CHLORIDE 0.9 % IV SOLN
3.0000 g | Freq: Four times a day (QID) | INTRAVENOUS | Status: DC
  Administered 2023-02-16 – 2023-02-18 (×7): 3 g via INTRAVENOUS
  Filled 2023-02-16 (×7): qty 8

## 2023-02-16 NOTE — H&P (Addendum)
 [facility] H&P   Patient Demographics:    Larry Rojas, is a 38 y.o. male  MRN: [id number]   DOB - 24-Sep-1985  Admit Date - 02/16/2023  Outpatient Primary MD for the patient is Larry Rojas  Referring MD/NP/PA: PA Celeste  Patient coming from: Home  Chief Complaint  Patient presents with   Fever      HPI:    Larry Rojas  is a 38 y.o. male, with past medical history of alcohol abuse, cervical spondylolysis status post C4-C5 ACDF in 01/16/22 by Dr. Smith at [facility], hypertension, hyperlipidemia, obesity, asthma, seizures. -Presents to ED secondary to complaint of fever, chills, cough and shortness of breath, patient reports symptoms started this morning, but he felt normal yesterday, he denies any sick contacts, but he does report left-sided pain related to cough, patient reports baseline chronic lower back pain, had recent ED visit 5 days ago secondary to encephalopathy from toxic alcohol intoxication, had previous visit in May secondary to seizures, where he left AMA after extubation (alcohol level was elevated then at 228,) it is elevated during last ED visit last week at 238), patient does endorse heavy alcohol use, patient reports episode of vomiting on Tuesday. -In ED patient febrile 102.9, tachypneic, tachycardic, lactic acid elevated at 2.5, chest x-ray significant for left lung opacity, [facility] hospitalist consulted to admit.    Review of systems:      A full 10 point Review of Systems was done, except as stated above, all other Review of Systems were negative.   With Past History of the following :    Past Medical History:  Diagnosis Date   Asthma    Back pain    Borderline diabetic    Diverticulitis 08/23/2021   Hypertension    Thyroid  disease       History reviewed. No pertinent surgical history.    Social History:     Social History   Tobacco  Use   Smoking status: Never   Smokeless tobacco: Never  Substance Use Topics   Alcohol use: Yes    Comment: socially        Family History :    History reviewed. No pertinent family history.    Home Medications:   Prior to Admission medications   Medication Sig Start Date End Date Taking? Authorizing Provider  acetaminophen  (TYLENOL ) 500 MG tablet Take 500 mg by mouth every 6 (six) hours as needed for moderate pain (pain score 4-6).   Yes [provider]  ALPRAZolam  (XANAX ) 0.5 MG tablet Take 1 mg by mouth 2 (two) times daily. 01/25/23  Yes [provider]  amLODipine  (NORVASC ) 10 MG tablet Take 10 mg by mouth daily.   Yes [provider]  ezetimibe  (ZETIA ) 10 MG tablet Take 10 mg by mouth daily. 02/01/23  Yes [provider]  gabapentin  (NEURONTIN ) 300 MG capsule Take 300 mg by  mouth 2 (two) times daily.   Yes [provider]  levETIRAcetam  (KEPPRA ) 500 MG tablet Take 1 tablet (500 mg total) by mouth 2 (two) times daily. Patient taking differently: Take 500 mg by mouth daily. 07/14/22  Yes Ilah Krabbe M, PA-C  levothyroxine  (SYNTHROID ) 25 MCG tablet Take 25 mcg by mouth daily. 02/11/21  Yes [provider]  meloxicam  (MOBIC ) 15 MG tablet Take 15 mg by mouth daily. 02/05/23  Yes [provider]  niacin  (VITAMIN B3) 500 MG ER tablet Take 500 mg by mouth at bedtime.   Yes [provider]  omeprazole (PRILOSEC) 20 MG capsule Take 20 mg by mouth daily as needed (Acid reflux).   Yes [provider]  oxyCODONE -acetaminophen  (PERCOCET) 7.5-325 MG tablet Take 1 tablet by mouth every 8 (eight) hours as needed for severe pain (pain score 7-10). 02/12/23 03/14/23 Yes [provider]  tiZANidine  (ZANAFLEX ) 4 MG tablet Take 4 mg by mouth every 6 (six) hours as needed for muscle spasms. 07/03/22  Yes [provider]     Allergies:     Allergies  Allergen Reactions   Fluoxetine Other (See  Comments)    Suicidal ideation   Trazodone And Nefazodone Other (See Comments)    Hallucinations    Hydrocodone       Physical Exam:   Vitals  Blood pressure 136/87, pulse 97, temperature 99.9 F (37.7 C), temperature source Oral, resp. rate 15, SpO2 93%.   1. General Developed male, with obesity, lies in bed in no apparent distress  2. Normal affect and insight, Not Suicidal or Homicidal, Awake Alert, Oriented X 3.  3. No F.N deficits, ALL C.Nerves Intact, Strength 5/5 all 4 extremities, Sensation intact all 4 extremities, Plantars down going.  4. Ears and Eyes appear Normal, Conjunctivae clear, PERRLA. Moist Oral Mucosa.  5. Supple Neck, No JVD, No cervical lymphadenopathy appriciated, No Carotid Bruits.  6. Symmetrical Chest wall movement, decreased air entry at lung bases with Rales on the left side  7. RRR, No Gallops, Rubs or Murmurs, No Parasternal Heave.  1 edema  8. Positive Bowel Sounds, Abdomen Soft, No tenderness, No organomegaly appriciated,No rebound -guarding or rigidity.  9.  No Cyanosis, Normal Skin Turgor, he has few skin scratches  10. Good muscle tone,  joints appear normal , no effusions, Normal ROM.     Data Review:    CBC Recent Labs  Lab 02/16/23 1434  WBC 10.6*  HGB 13.7  HCT 38.8*  PLT 145*  MCV 95.8  MCH 33.8  MCHC 35.3  RDW 11.5  LYMPHSABS 1.3  MONOABS 0.5  EOSABS 0.1  BASOSABS 0.0   ------------------------------------------------------------------------------------------------------------------  Chemistries  Recent Labs  Lab 02/16/23 1434  NA 138  K 3.6  CL 105  CO2 23  GLUCOSE 145*  BUN 15  CREATININE 0.86  CALCIUM 8.4*  AST 79*  ALT 81*  ALKPHOS 54  BILITOT 1.4*   ------------------------------------------------------------------------------------------------------------------ estimated creatinine clearance is 158 mL/min (by C-G formula based on SCr of 0.86  mg/dL). ------------------------------------------------------------------------------------------------------------------ No results for input(s): TSH, T4TOTAL, T3FREE, THYROIDAB in the last 72 hours.  Invalid input(s): FREET3  Coagulation profile Recent Labs  Lab 02/16/23 1434  INR 1.0   ------------------------------------------------------------------------------------------------------------------- No results for input(s): DDIMER in the last 72 hours. -------------------------------------------------------------------------------------------------------------------  Cardiac Enzymes No results for input(s): CKMB, TROPONINI, MYOGLOBIN in the last 168 hours.  Invalid input(s): CK ------------------------------------------------------------------------------------------------------------------    Component Value Date/Time   BNP 23.2 01/02/2020 0535     ---------------------------------------------------------------------------------------------------------------  Urinalysis    Component Value Date/Time   COLORURINE YELLOW 02/16/2023 1444   APPEARANCEUR CLEAR 02/16/2023 1444   LABSPEC 1.017 02/16/2023 1444   PHURINE 6.0 02/16/2023 1444   GLUCOSEU NEGATIVE 02/16/2023 1444   HGBUR NEGATIVE 02/16/2023 1444   BILIRUBINUR NEGATIVE 02/16/2023 1444   KETONESUR 5 (A) 02/16/2023 1444   PROTEINUR NEGATIVE 02/16/2023 1444   UROBILINOGEN 1.0 01/09/2007 0333   NITRITE NEGATIVE 02/16/2023 1444   LEUKOCYTESUR NEGATIVE 02/16/2023 1444    ----------------------------------------------------------------------------------------------------------------   Imaging Results:    DG Chest 2 View Result Date: 02/16/2023 CLINICAL DATA:  Shortness of breath and chest pain EXAM: CHEST - 2 VIEW COMPARISON:  X-ray 07/13/2022 FINDINGS: Underinflation. No pneumothorax or effusion. Normal cardiopericardial silhouette. Overlapping cardiac leads. No consolidation. Bronchovascular  crowding. Question slight opacity in the left lung base. Lateral view is under penetrated and limited by motion. IMPRESSION: Limited x-rays. Underinflation. Subtle left lung base opacity. Recommend follow-up. Electronically Signed   By: Ranell Bring M.D.   On: 02/16/2023 16:01     EKG:  PR interval 124 ms QRS duration 96 ms QT/QTcB 302/425 ms P-R-T axes 86 78 13 Sinus tachycardia Low voltage, precordial leads RSR' in V1 or V2, right VCD or RVH  Assessment & Plan:    Principal Problem:   Pneumonia Active Problems:   Alcohol abuse   Seizures (HCC)    Sepsis, present on admission due to aspiration pneumonia  -Sepsis present on admission, fever 102.9, tachycardic 123, tachypneic 28, with evaded lactic acid at 2.4 -Left lung base opacity, secondary to aspiration pneumonia, he endorsed vomiting Wednesday, clear in the setting of alcohol abuse. -Admitted drip pneumonia pathway, follow on Legionella antigen, strep pneumonia antigen, and sputum cultures, blood cultures - Continue with IV Unasyn  for concern of aspiration pneumonia, continue with azithromycin  as well for atypical coverage -Ordered incentive spirometer and flutter valve, encouraged to use.  Hypertension -blood pressure elevated, continue with amlodipine , will add as needed hydralazine .  Alcohol abuse -He was counseled, will start on CIWA protocol  Nontypical chest pain -Nontypical, musculoskeletal, related to cough, negative troponins, EKG nonacute  Transaminitis Hyperammonemia Hyperbilirubinemia -Due to alcohol abuse, I have discussed with him at length that his alcohol abuse contributing to alcoholic liver disease.  Seizures -Continue with home Keppra    GERD -Continue with PPI  Hypothyroidism -Continue with Synthroid   Hyperlipidemia -Continue with Zetia  and niacin   Obseity      DVT Prophylaxis  Lovenox    AM Labs Ordered, also please review Full Orders  Family Communication: Admission, patients  condition and plan of care including tests being ordered have been discussed with the patient and wife at bedside who indicate understanding and agree with the plan and Code Status.  Code Status full code  Likely DC to home  Consults called: None  Admission status: Inpatient  Time spent in minutes : 70 minutes   Brayton Lye M.D on 02/16/2023 at 5:12 PM   [facility] - Office  [phone]

## 2023-02-16 NOTE — ED Notes (Signed)
 ED Provider at bedside.

## 2023-02-16 NOTE — Progress Notes (Signed)
 Pharmacy Antibiotic Note  Larry Rojas is a 38 y.o. male admitted on 02/16/2023 with aspiration pneumonia.  Pharmacy has been consulted for unasyn  dosing.  Plan: Unasyn  3gm IV q6h Also, on azithromycin  500mg  IV q24h F/U cxs and clinical progress Monitor V/s, labs    Temp (24hrs), Avg:101.8 F (38.8 C), Min:99.9 F (37.7 C), Max:102.9 F (39.4 C)  Recent Labs  Lab 02/16/23 1434  WBC 10.6*  CREATININE 0.86  LATICACIDVEN 2.5*    Estimated Creatinine Clearance: 158 mL/min (by C-G formula based on SCr of 0.86 mg/dL).    Allergies  Allergen Reactions   Fluoxetine Other (See Comments)    Suicidal ideation   Trazodone And Nefazodone Other (See Comments)    Hallucinations    Hydrocodone      Antimicrobials this admission: unasyn  1/3 >>  Azithromycin   1/3 >>  Doxycycline  po x 1 tab 1/3 in ED  Microbiology results: 1/3 BCx: pending   Thank you for allowing pharmacy to be a part of this patient's care.  Reyli Schroth, BS Pharm D, BCPS Clinical Pharmacist 02/16/2023 5:15 PM

## 2023-02-16 NOTE — ED Provider Notes (Addendum)
 [place] EMERGENCY DEPARTMENT AT [facility] Provider Note   CSN: [id number] Arrival date & time: 02/16/23  1329     History  Chief Complaint  Patient presents with   Fever    Larry Rojas is a 38 y.o. male.  He has PMH of asthma and hypertension.  He presents to the ER for fever, chills, cough, shortness of breath started suddenly this morning.  States he felt normal yesterday, denies sick contacts.  He states having left sided chest pain that is constant with no exacerbating relieving factors, he does report back pain as well but states this feeling is chronic back pain.  No sweating, dizziness, syncope or palpitations.  Reports he drinks alcohol occasionally, does not use drugs and does not smoke.     Fever      Home Medications Prior to Admission medications   Medication Sig Start Date End Date Taking? Authorizing Provider  ALPRAZolam  (XANAX ) 0.5 MG tablet Take 0.5-1 mg by mouth 2 (two) times daily. 01/25/23   [provider]  amLODipine  (NORVASC ) 10 MG tablet Take 10 mg by mouth daily.    [provider]  clonazePAM (KLONOPIN) 0.5 MG tablet Take 0.5 mg by mouth 2 (two) times daily as needed for anxiety. Patient not taking: Reported on 02/09/2023 05/31/20   [provider]  ezetimibe  (ZETIA ) 10 MG tablet Take 10 mg by mouth daily. 02/01/23   [provider]  gabapentin  (NEURONTIN ) 300 MG capsule Take 300 mg by mouth 2 (two) times daily.    [provider]  levETIRAcetam  (KEPPRA ) 500 MG tablet Take 1 tablet (500 mg total) by mouth 2 (two) times daily. 07/14/22   Ilah Corean HERO, PA-C  levothyroxine  (SYNTHROID ) 25 MCG tablet Take 25 mcg by mouth daily. 02/11/21   [provider]  MELATONIN PO Take 1 tablet by mouth at bedtime as needed (Sleep). Patient not taking: Reported on 02/09/2023    [provider]  meloxicam  (MOBIC ) 15 MG tablet Take 15 mg by mouth daily. 02/05/23   [provider]   omeprazole (PRILOSEC) 20 MG capsule Take 20 mg by mouth daily as needed (Acid reflux).    [provider]  tiZANidine  (ZANAFLEX ) 4 MG tablet Take 4 mg by mouth every 6 (six) hours as needed for muscle spasms. 07/03/22   [provider]      Allergies    Fluoxetine, Trazodone and nefazodone, and Hydrocodone     Review of Systems   Review of Systems  Constitutional:  Positive for fever.    Physical Exam Updated Vital Signs There were no vitals taken for this visit. Physical Exam Vitals and nursing note reviewed.  Constitutional:      General: He is not in acute distress.    Appearance: He is well-developed.  HENT:     Head: Normocephalic and atraumatic.     Mouth/Throat:     Mouth: Mucous membranes are dry.  Eyes:     Conjunctiva/sclera: Conjunctivae normal.  Cardiovascular:     Rate and Rhythm: Normal rate and regular rhythm.     Heart sounds: No murmur heard. Pulmonary:     Effort: Pulmonary effort is normal. No respiratory distress.     Breath sounds: Normal breath sounds.  Abdominal:     Palpations: Abdomen is soft.     Tenderness: There is no abdominal tenderness.  Musculoskeletal:        General: No swelling.     Cervical back: Neck supple.  Skin:  General: Skin is warm and dry.     Capillary Refill: Capillary refill takes less than 2 seconds.  Neurological:     General: No focal deficit present.     Mental Status: He is alert and oriented to person, place, and time.  Psychiatric:        Mood and Affect: Mood normal.     ED Results / Procedures / Treatments   Labs (all labs ordered are listed, but only abnormal results are displayed) Labs Reviewed - No data to display  EKG None  Radiology No results found.  Procedures .Critical Care  Performed by: Suellen Sherran LABOR, PA-C Authorized by: Suellen Sherran LABOR, PA-C   Critical care provider statement:    Critical care time (minutes):  30   Critical care time was exclusive of:   Separately billable procedures and treating other patients   Critical care was necessary to treat or prevent imminent or life-threatening deterioration of the following conditions:  Sepsis   Critical care was time spent personally by me on the following activities:  Development of treatment plan with patient or surrogate, discussions with consultants, evaluation of patient's response to treatment, examination of patient, ordering and review of laboratory studies, ordering and review of radiographic studies, ordering and performing treatments and interventions, pulse oximetry, re-evaluation of patient's condition and review of old charts   Care discussed with: admitting provider       Medications Ordered in ED Medications  acetaminophen  (TYLENOL ) 325 MG tablet (has no administration in time range)    ED Course/ Medical Decision Making/ A&P Clinical Course as of 02/16/23 1415  Fri Feb 16, 2023  [id number] 38 year old male here with tachycardia fever tachypnea in the setting of cough and shortness of breath.  Recent ED visit for possible overdose.  Getting labs and imaging.  Disposition per results of testing [MB]  1413 Comes in with fever, body aches, chills, cough and shortness of breath and chest pain.  He is alert oriented, no abdominal pain or tenderness, lungs clear to auscultation though he received Solu-Medrol and a DuoNeb with relief of his shortness of breath and route via EMS.  Was satting low 90s for EMS and is on 2 L nasal cannula at this time.  Says a protocol ordered, patient given Tylenol , IV fluids, awaiting chest x-ray, suspect possible pneumonia including possible aspiration pneumonia as patient was seen on 02/09/2023 for alcohol intoxication and had been found unresponsive by family who apparently did some chest compressions.  This could also represent a viral illness such as COVID or flu. [CB]    Clinical Course User Index [CB] Suellen Sherran LABOR, PA-C [MB] Towana Ozell BROCKS, MD                                  Medical Decision Making This patient presents to the ED for concern of fever, SOB, this involves an extensive number of treatment options, and is a complaint that carries with it a high risk of complications and morbidity.  The differential diagnosis includes pneumonia, bronchitis, PE, sepsis, other   Co morbidities that complicate the patient evaluation :   asthma   Additional history obtained:  Additional history obtained from EMR External records from outside source obtained and reviewed including notes, labs, imaging   Lab Tests:  I Ordered, and personally interpreted labs.  The pertinent results include: Mild leukocytosis noted, mildly elevated LFTs, negative COVID flu and  RSV, normal coagulation studies, troponin is 5, urinalysis is normal, lactic is elevated at 2.5   Imaging Studies ordered:  I ordered imaging studies including chest x-ray which shows lower lobe infiltrate I independently visualized and interpreted imaging within scope of identifying emergent findings  I agree with the radiologist interpretation   Cardiac Monitoring: / EKG:  The patient was maintained on a cardiac monitor.  I personally viewed and interpreted the cardiac monitored which showed an underlying rhythm of: Sinus tachycardia   Consultations Obtained:  I requested consultation with the plus Dr. Sherlon,  and discussed lab and imaging findings as well as pertinent plan - they recommend: Admission to hospital for treatment of pneumonia   Problem List / ED Course / Critical interventions / Medication management Severe sepsis due to pneumonia.  Will get this morning with fever, shortness of breath, constant left-sided chest pain.  No recent illness, no sick contacts.  Noted to meet sepsis criteria with temperature of 102.9, heart rate 120s.  He was ill-appearing.  He also has a lactic acidosis of 2.5.  He was given IV fluids, chest x-ray shows left lower lobe  infiltrate.  I do question if he potentially aspirated several days ago when he was intoxicated was found responsive by family so I opted for doxycycline  versus azithromycin  for treatment as this will cover oral flora as well as regular community-acquired pneumonia organisms.  Patient has been placed on 2 L of nasal cannula and her saturations are low 90s per EMS, he had improvement of his shortness of breath with albuterol  and Solu-Medrol given by EMS.  Patient reevaluated after taking nasal cannula, now satting 95% on room air we will continue to monitor this closely.  Is also complaining of his chronic back pain, asking for his usual oxycodone  which is provided along with Toradol .  Also missed his home Xanax  dose this morning he reports he was given this as well to prevent any withdrawal symptoms. It is agreeable with admission I ordered medication including Tylenol  for fever Reevaluation of the patient after these medicines showed that the patient improved I have reviewed the patients home medicines and have made adjustments as needed       Amount and/or Complexity of Data Reviewed Labs: ordered. Radiology: ordered.  Risk OTC drugs. Prescription drug management. Decision regarding hospitalization.           Final Clinical Impression(s) / ED Diagnoses Final diagnoses:  None    Rx / DC Orders ED Discharge Orders     None         Suellen Sherran DELENA DEVONNA 02/16/23 1704    Towana Ozell BROCKS, MD 02/16/23 1754    Suellen Sherran DELENA, PA-C 02/16/23 2357    Towana Ozell BROCKS, MD 02/17/23 [id number]

## 2023-02-16 NOTE — ED Triage Notes (Signed)
 Pt BIB [facility] for chills, fever and SOB, pt with asthma. Pt given albuterol neb tx and 125 mg Solumedrol en route, IV started by EMS 20 G to rt AC. P c/o mid CP and back pain

## 2023-02-17 DIAGNOSIS — J189 Pneumonia, unspecified organism: Secondary | ICD-10-CM | POA: Diagnosis not present

## 2023-02-17 LAB — CBC
HCT: 39.6 % (ref 39.0–52.0)
Hemoglobin: 13.9 g/dL (ref 13.0–17.0)
MCH: 34 pg (ref 26.0–34.0)
MCHC: 35.1 g/dL (ref 30.0–36.0)
MCV: 96.8 fL (ref 80.0–100.0)
Platelets: 157 10*3/uL (ref 150–400)
RBC: 4.09 MIL/uL — ABNORMAL LOW (ref 4.22–5.81)
RDW: 11.5 % (ref 11.5–15.5)
WBC: 18.5 10*3/uL — ABNORMAL HIGH (ref 4.0–10.5)
nRBC: 0 % (ref 0.0–0.2)

## 2023-02-17 LAB — COMPREHENSIVE METABOLIC PANEL
ALT: 75 U/L — ABNORMAL HIGH (ref 0–44)
AST: 53 U/L — ABNORMAL HIGH (ref 15–41)
Albumin: 3.9 g/dL (ref 3.5–5.0)
Alkaline Phosphatase: 48 U/L (ref 38–126)
Anion gap: 9 (ref 5–15)
BUN: 15 mg/dL (ref 6–20)
CO2: 23 mmol/L (ref 22–32)
Calcium: 9 mg/dL (ref 8.9–10.3)
Chloride: 108 mmol/L (ref 98–111)
Creatinine, Ser: 0.84 mg/dL (ref 0.61–1.24)
GFR, Estimated: >60 mL/min (ref >60)
Glucose, Bld: 166 mg/dL — ABNORMAL HIGH (ref 70–99)
Potassium: 3.9 mmol/L (ref 3.5–5.1)
Sodium: 140 mmol/L (ref 135–145)
Total Bilirubin: 1.3 mg/dL — ABNORMAL HIGH (ref 0.0–1.2)
Total Protein: 6.9 g/dL (ref 6.5–8.1)

## 2023-02-17 LAB — LACTIC ACID, PLASMA: Lactic Acid, Venous: 1.4 mmol/L (ref 0.5–1.9)

## 2023-02-17 LAB — HIV ANTIBODY (ROUTINE TESTING W REFLEX): HIV Screen 4th Generation wRfx: NONREACTIVE

## 2023-02-17 NOTE — Progress Notes (Signed)
 PROGRESS NOTE    Larry Rojas  FMW:[id number] DOB: Jul 02, 1985 DOA: 02/16/2023 PCP: Nena Cyndee LABOR, PA-C   Brief Narrative:     Larry Rojas  is a 38 y.o. male, with past medical history of alcohol abuse, cervical spondylolysis status post C4-C5 ACDF in 01/16/22 by Dr. Smith at [place], hypertension, hyperlipidemia, obesity, asthma, seizures. -Presented to ED secondary to complaint of fever, chills, cough and shortness of breath.  Patient was admitted with sepsis, POA due to aspiration pneumonia in the setting of recurrent alcohol abuse.  Assessment & Plan:   Principal Problem:   Pneumonia Active Problems:   Alcohol abuse   Seizures (HCC)  Assessment and Plan:  Sepsis, present on admission due to aspiration pneumonia  -Sepsis present on admission, fever 102.9, tachycardic 123, tachypneic 28, with evaded lactic acid at 2.4 -Left lung base opacity, secondary to aspiration pneumonia, he endorsed vomiting Wednesday, clear in the setting of alcohol abuse. -Admitted drip pneumonia pathway, follow on Legionella antigen, strep pneumonia antigen, and sputum cultures, blood cultures - Continue with IV Unasyn  for concern of aspiration pneumonia, continue with azithromycin  as well for atypical coverage -Ordered incentive spirometer and flutter valve, encouraged to use.   Hypertension -blood pressure elevated, continue with amlodipine , will add as needed hydralazine .   Alcohol abuse -He was counseled, will start on CIWA protocol   Nontypical chest pain -Nontypical, musculoskeletal, related to cough, negative troponins, EKG nonacute   Transaminitis Hyperammonemia Hyperbilirubinemia -Due to alcohol abuse, I have discussed with him at length that his alcohol abuse contributing to alcoholic liver disease.   Seizures -Continue with home Keppra     GERD -Continue with PPI   Hypothyroidism -Continue with Synthroid    Hyperlipidemia -Continue with Zetia  and niacin    Obesity,  Class I -BMI 31.82    DVT prophylaxis:Lovenox  Code Status: Full Family Communication: Spouse at bedside 1/4 Disposition Plan:  Status is: Inpatient Remains inpatient appropriate because: Need for IV antibiotics.   Consultants:  None  Procedures:  None  Antimicrobials:  Anti-infectives (From admission, onward)    Start     Dose/Rate Route Frequency Ordered Stop   02/16/23 1800  Ampicillin -Sulbactam (UNASYN ) 3 g in sodium chloride  0.9 % 100 mL IVPB        3 g 200 mL/hr over 30 Minutes Intravenous Every 6 hours 02/16/23 1715     02/16/23 1730  azithromycin  (ZITHROMAX ) 500 mg in sodium chloride  0.9 % 250 mL IVPB        500 mg 250 mL/hr over 60 Minutes Intravenous Every 24 hours 02/16/23 1710     02/16/23 1615  cefTRIAXone  (ROCEPHIN ) 1 g in sodium chloride  0.9 % 100 mL IVPB        1 g 200 mL/hr over 30 Minutes Intravenous  Once 02/16/23 1614 02/16/23 1742   02/16/23 1615  doxycycline  (VIBRA -TABS) tablet 100 mg        100 mg Oral  Once 02/16/23 1614 02/16/23 1629      Subjective: Patient seen and evaluated today with no new acute complaints or concerns. No acute concerns or events noted overnight.  Objective: Vitals:   02/16/23 2145 02/16/23 2325 02/17/23 0136 02/17/23 0518  BP: (!) 157/89 (!) 154/86 110/60 117/78  Pulse: 87 91 72 71  Resp:   18 18  Temp:   98.5 F (36.9 C) 97.9 F (36.6 C)  TempSrc:   Oral Oral  SpO2: 94% 98% 95% 96%  Weight:      Height:  Intake/Output Summary (Last 24 hours) at 02/17/2023 0721 Last data filed at 02/17/2023 0615 Gross per 24 hour  Intake 2081.07 ml  Output 450 ml  Net 1631.07 ml   Filed Weights   02/16/23 1830  Weight: 112.4 kg    Examination:  General exam: Appears calm and comfortable  Respiratory system: Clear to auscultation. Respiratory effort normal.  Nasal cannula oxygen Cardiovascular system: S1 & S2 heard, RRR.  Gastrointestinal system: Abdomen is soft Central nervous system: Alert and awake Extremities:  No edema Skin: No significant lesions noted Psychiatry: Flat affect.    Data Reviewed: I have personally reviewed following labs and imaging studies  CBC: Recent Labs  Lab 02/16/23 1434 02/17/23 0356  WBC 10.6* 18.5*  NEUTROABS 8.7*  --   HGB 13.7 13.9  HCT 38.8* 39.6  MCV 95.8 96.8  PLT 145* 157   Basic Metabolic Panel: Recent Labs  Lab 02/16/23 1434 02/16/23 1648 02/17/23 0356  NA 138  --  140  K 3.6  --  3.9  CL 105  --  108  CO2 23  --  23  GLUCOSE 145*  --  166*  BUN 15  --  15  CREATININE 0.86  --  0.84  CALCIUM 8.4*  --  9.0  MG  --  1.7  --   PHOS  --  1.2*  --    GFR: Estimated Creatinine Clearance: 163.7 mL/min (by C-G formula based on SCr of 0.84 mg/dL). Liver Function Tests: Recent Labs  Lab 02/16/23 1434 02/17/23 0356  AST 79* 53*  ALT 81* 75*  ALKPHOS 54 48  BILITOT 1.4* 1.3*  PROT 6.4* 6.9  ALBUMIN 3.8 3.9   No results for input(s): LIPASE, AMYLASE in the last 168 hours. No results for input(s): AMMONIA  in the last 168 hours. Coagulation Profile: Recent Labs  Lab 02/16/23 1434  INR 1.0   Cardiac Enzymes: No results for input(s): CKTOTAL, CKMB, CKMBINDEX, TROPONINI in the last 168 hours. BNP (last 3 results) No results for input(s): PROBNP in the last 8760 hours. HbA1C: No results for input(s): HGBA1C in the last 72 hours. CBG: Recent Labs  Lab 02/16/23 2106  GLUCAP 253*   Lipid Profile: No results for input(s): CHOL, HDL, LDLCALC, TRIG, CHOLHDL, LDLDIRECT in the last 72 hours. Thyroid  Function Tests: No results for input(s): TSH, T4TOTAL, FREET4, T3FREE, THYROIDAB in the last 72 hours. Anemia Panel: No results for input(s): VITAMINB12, FOLATE, FERRITIN, TIBC, IRON, RETICCTPCT in the last 72 hours. Sepsis Labs: Recent Labs  Lab 02/16/23 1434 02/16/23 1648  PROCALCITON  --  0.49  LATICACIDVEN 2.5* 2.0*    Recent Results (from the past 240 hours)  Resp panel by RT-PCR  (RSV, Flu A&B, Covid) Anterior Nasal Swab     Status: None   Collection Time: 02/16/23  1:52 PM   Specimen: Anterior Nasal Swab  Result Value Ref Range Status   SARS Coronavirus 2 by RT PCR NEGATIVE NEGATIVE Final    Comment: (NOTE) SARS-CoV-2 target nucleic acids are NOT DETECTED.  The SARS-CoV-2 RNA is generally detectable in upper respiratory specimens during the acute phase of infection. The lowest concentration of SARS-CoV-2 viral copies this assay can detect is 138 copies/mL. A negative result does not preclude SARS-Cov-2 infection and should not be used as the sole basis for treatment or other patient management decisions. A negative result may occur with  improper specimen collection/handling, submission of specimen other than nasopharyngeal swab, presence of viral mutation(s) within the areas targeted by  this assay, and inadequate number of viral copies(<138 copies/mL). A negative result must be combined with clinical observations, patient history, and epidemiological information. The expected result is Negative.  Fact Sheet for Patients:  bloggercourse.com  Fact Sheet for Healthcare Providers:  seriousbroker.it  This test is no t yet approved or cleared by the [place]  FDA and  has been authorized for detection and/or diagnosis of SARS-CoV-2 by FDA under an Emergency Use Authorization (EUA). This EUA will remain  in effect (meaning this test can be used) for the duration of the COVID-19 declaration under Section 564(b)(1) of the Act, 21 U.S.C.section 360bbb-3(b)(1), unless the authorization is terminated  or revoked sooner.       Influenza A by PCR NEGATIVE NEGATIVE Final   Influenza B by PCR NEGATIVE NEGATIVE Final    Comment: (NOTE) The Xpert Xpress SARS-CoV-2/FLU/RSV plus assay is intended as an aid in the diagnosis of influenza from Nasopharyngeal swab specimens and should not be used as a sole basis for  treatment. Nasal washings and aspirates are unacceptable for Xpert Xpress SARS-CoV-2/FLU/RSV testing.  Fact Sheet for Patients: bloggercourse.com  Fact Sheet for Healthcare Providers: seriousbroker.it  This test is not yet approved or cleared by the [place]  FDA and has been authorized for detection and/or diagnosis of SARS-CoV-2 by FDA under an Emergency Use Authorization (EUA). This EUA will remain in effect (meaning this test can be used) for the duration of the COVID-19 declaration under Section 564(b)(1) of the Act, 21 U.S.C. section 360bbb-3(b)(1), unless the authorization is terminated or revoked.     Resp Syncytial Virus by PCR NEGATIVE NEGATIVE Final    Comment: (NOTE) Fact Sheet for Patients: bloggercourse.com  Fact Sheet for Healthcare Providers: seriousbroker.it  This test is not yet approved or cleared by the [place]  FDA and has been authorized for detection and/or diagnosis of SARS-CoV-2 by FDA under an Emergency Use Authorization (EUA). This EUA will remain in effect (meaning this test can be used) for the duration of the COVID-19 declaration under Section 564(b)(1) of the Act, 21 U.S.C. section 360bbb-3(b)(1), unless the authorization is terminated or revoked.  Performed at [facility], [address] [place], [place] [address]   Blood Culture (routine x 2)     Status: None (Preliminary result)   Collection Time: 02/16/23  2:34 PM   Specimen: BLOOD LEFT HAND  Result Value Ref Range Status   Specimen Description   Final    BLOOD LEFT HAND BOTTLES DRAWN AEROBIC AND ANAEROBIC   Special Requests Blood Culture adequate volume  Final   Culture   Final    NO GROWTH < 24 HOURS Performed at [facility], [address], [place], [place] [address]    Report Status PENDING  Incomplete  Blood Culture (routine x 2)     Status: None (Preliminary result)    Collection Time: 02/16/23  2:40 PM   Specimen: Left Antecubital; Blood  Result Value Ref Range Status   Specimen Description   Final    LEFT ANTECUBITAL BOTTLES DRAWN AEROBIC AND ANAEROBIC   Special Requests Blood Culture adequate volume  Final   Culture   Final    NO GROWTH < 24 HOURS Performed at [facility], [address] [place], [place] [address]    Report Status PENDING  Incomplete         Radiology Studies: DG Chest 2 View Result Date: 02/16/2023 CLINICAL DATA:  Shortness of breath and chest pain EXAM: CHEST - 2 VIEW COMPARISON:  X-ray  07/13/2022 FINDINGS: Underinflation. No pneumothorax or effusion. Normal cardiopericardial silhouette. Overlapping cardiac leads. No consolidation. Bronchovascular crowding. Question slight opacity in the left lung base. Lateral view is under penetrated and limited by motion. IMPRESSION: Limited x-rays. Underinflation. Subtle left lung base opacity. Recommend follow-up. Electronically Signed   By: Ranell Bring M.D.   On: 02/16/2023 16:01        Scheduled Meds:  ALPRAZolam   1 mg Oral BID   amLODipine   10 mg Oral Q2200   enoxaparin  (LOVENOX ) injection  40 mg Subcutaneous Q24H   ezetimibe   10 mg Oral QHS   folic acid   1 mg Oral Daily   gabapentin   300 mg Oral BID   levETIRAcetam   500 mg Oral BID   levothyroxine   25 mcg Oral Daily   meloxicam   15 mg Oral Daily   multivitamin with minerals  1 tablet Oral Daily   niacin   500 mg Oral QHS   pantoprazole   40 mg Oral Daily   thiamine   100 mg Oral Daily   Or   thiamine   100 mg Intravenous Daily   Continuous Infusions:  ampicillin -sulbactam (UNASYN ) IV 200 mL/hr at 02/17/23 0615   azithromycin  Stopped (02/16/23 1927)     LOS: 1 day    Time spent: 55 minutes    Vincenzo Stave JONETTA Fairly, DO [facility]  If 7PM-7AM, please contact night-coverage [email or web address] 02/17/2023, 7:21 AM

## 2023-02-17 NOTE — Plan of Care (Signed)

## 2023-02-17 NOTE — Progress Notes (Signed)
   02/17/23 1526  TOC Brief Assessment  Insurance and Status Reviewed  Patient has primary care physician Yes  Home environment has been reviewed From Home  Prior level of function: Independent  Prior/Current Home Services No current home services  Social Drivers of Health Review SDOH reviewed no interventions necessary  Readmission risk has been reviewed Yes  Transition of care needs no transition of care needs at this time     Transition of Care Department [facility]) has reviewed patient and no TOC needs have been identified at this time. We will continue to monitor patient advancement through interdisciplinary progression rounds. If new patient transition needs arise, please place a TOC consult.

## 2023-02-18 DIAGNOSIS — J189 Pneumonia, unspecified organism: Secondary | ICD-10-CM | POA: Diagnosis not present

## 2023-02-18 LAB — CBC
HCT: 37 % — ABNORMAL LOW (ref 39.0–52.0)
Hemoglobin: 12.8 g/dL — ABNORMAL LOW (ref 13.0–17.0)
MCH: 34.1 pg — ABNORMAL HIGH (ref 26.0–34.0)
MCHC: 34.6 g/dL (ref 30.0–36.0)
MCV: 98.7 fL (ref 80.0–100.0)
Platelets: 151 10*3/uL (ref 150–400)
RBC: 3.75 MIL/uL — ABNORMAL LOW (ref 4.22–5.81)
RDW: 11.8 % (ref 11.5–15.5)
WBC: 15.6 10*3/uL — ABNORMAL HIGH (ref 4.0–10.5)
nRBC: 0 % (ref 0.0–0.2)

## 2023-02-18 LAB — COMPREHENSIVE METABOLIC PANEL
ALT: 61 U/L — ABNORMAL HIGH (ref 0–44)
AST: 36 U/L (ref 15–41)
Albumin: 3.5 g/dL (ref 3.5–5.0)
Alkaline Phosphatase: 41 U/L (ref 38–126)
Anion gap: 10 (ref 5–15)
BUN: 14 mg/dL (ref 6–20)
CO2: 22 mmol/L (ref 22–32)
Calcium: 8.6 mg/dL — ABNORMAL LOW (ref 8.9–10.3)
Chloride: 106 mmol/L (ref 98–111)
Creatinine, Ser: 0.83 mg/dL (ref 0.61–1.24)
GFR, Estimated: >60 mL/min (ref >60)
Glucose, Bld: 238 mg/dL — ABNORMAL HIGH (ref 70–99)
Potassium: 3.5 mmol/L (ref 3.5–5.1)
Sodium: 138 mmol/L (ref 135–145)
Total Bilirubin: 0.9 mg/dL (ref 0.0–1.2)
Total Protein: 6.4 g/dL — ABNORMAL LOW (ref 6.5–8.1)

## 2023-02-18 LAB — MAGNESIUM: Magnesium: 2.1 mg/dL (ref 1.7–2.4)

## 2023-02-18 MED ORDER — AMOXICILLIN-POT CLAVULANATE 875-125 MG PO TABS: 1.0000 | ORAL_TABLET | Freq: Two times a day (BID) | ORAL | 0 refills | Status: AC

## 2023-02-18 NOTE — Discharge Summary (Signed)
 Physician Discharge Summary  EDON HOADLEY FMW:[id number] DOB: [birth year] DOA: 02/16/2023  PCP: Nena Cyndee LABOR, PA-C  Admit date: 02/16/2023  Discharge date: 02/18/2023  Admitted From:Home  Disposition:  Home  Recommendations for Outpatient Follow-up:  Follow up with PCP in 1-2 weeks Remain on Augmentin  as prescribed for 5 more days to complete course of treatment for aspiration pneumonia Counseled on alcohol cessation Continue other home medications as prior  Home Health: None  Equipment/Devices: None  Discharge Condition:Stable  CODE STATUS: Full  Diet recommendation: Heart Healthy  Brief/Interim Summary:  Larry Rojas  is a 38 y.o. male, with past medical history of alcohol abuse, cervical spondylolysis status post C4-C5 ACDF in 01/16/22 by Dr. Smith at [facility], hypertension, hyperlipidemia, obesity, asthma, seizures. -Presented to ED secondary to complaint of fever, chills, cough and shortness of breath.   Patient was admitted with sepsis, POA due to aspiration pneumonia in the setting of recurrent alcohol abuse.  He was treated empirically with IV antibiotics for a couple days and is now in stable condition for discharge.  He will continue on Augmentin  as prescribed to complete course of treatment and has been counseled on alcohol cessation.  No other acute events or concerns noted.  Discharge Diagnoses:  Principal Problem:   Pneumonia Active Problems:   Alcohol abuse   Seizures (HCC)  Principal discharge diagnosis: Sepsis, POA due to aspiration pneumonia in the setting of alcohol abuse.  Discharge Instructions  Discharge Instructions     Diet - low sodium heart healthy   Complete by: As directed    Increase activity slowly   Complete by: As directed       Allergies as of 02/18/2023       Reactions   Fluoxetine Other (See Comments)   Suicidal ideation   Trazodone And Nefazodone Other (See Comments)   Hallucinations   Hydrocodone          Medication  List     TAKE these medications    acetaminophen  500 MG tablet Commonly known as: TYLENOL  Take 500 mg by mouth every 6 (six) hours as needed for moderate pain (pain score 4-6).   ALPRAZolam  0.5 MG tablet Commonly known as: XANAX  Take 1 mg by mouth 2 (two) times daily.   amLODipine  10 MG tablet Commonly known as: NORVASC  Take 10 mg by mouth daily.   amoxicillin -clavulanate 875-125 MG tablet Commonly known as: AUGMENTIN  Take 1 tablet by mouth 2 (two) times daily for 5 days.   ezetimibe  10 MG tablet Commonly known as: ZETIA  Take 10 mg by mouth daily.   gabapentin  300 MG capsule Commonly known as: NEURONTIN  Take 300 mg by mouth 2 (two) times daily.   levETIRAcetam  500 MG tablet Commonly known as: Keppra  Take 1 tablet (500 mg total) by mouth 2 (two) times daily. What changed: when to take this   levothyroxine  25 MCG tablet Commonly known as: SYNTHROID  Take 25 mcg by mouth daily.   meloxicam  15 MG tablet Commonly known as: MOBIC  Take 15 mg by mouth daily.   niacin  500 MG ER tablet Commonly known as: VITAMIN B3 Take 500 mg by mouth at bedtime.   omeprazole 20 MG capsule Commonly known as: PRILOSEC Take 20 mg by mouth daily as needed (Acid reflux).   oxyCODONE -acetaminophen  7.5-325 MG tablet Commonly known as: PERCOCET Take 1 tablet by mouth every 8 (eight) hours as needed for severe pain (pain score 7-10).   tiZANidine  4 MG tablet Commonly known as: ZANAFLEX  Take 4 mg by mouth every  6 (six) hours as needed for muscle spasms.        Follow-up Information     Nena Cyndee LABOR, PA-C. Schedule an appointment as soon as possible for a visit in 1 week(s).   Specialty: General Practice Contact information: [address] [place] [address] [phone]                Allergies  Allergen Reactions   Fluoxetine Other (See Comments)    Suicidal ideation   Trazodone And Nefazodone Other (See Comments)    Hallucinations    Hydrocodone       Consultations: None   Procedures/Studies: DG Chest 2 View Result Date: 02/16/2023 CLINICAL DATA:  Shortness of breath and chest pain EXAM: CHEST - 2 VIEW COMPARISON:  X-ray 07/13/2022 FINDINGS: Underinflation. No pneumothorax or effusion. Normal cardiopericardial silhouette. Overlapping cardiac leads. No consolidation. Bronchovascular crowding. Question slight opacity in the left lung base. Lateral view is under penetrated and limited by motion. IMPRESSION: Limited x-rays. Underinflation. Subtle left lung base opacity. Recommend follow-up. Electronically Signed   By: Ranell Bring M.D.   On: 02/16/2023 16:01   CT Head Wo Contrast Result Date: 02/09/2023 CLINICAL DATA:  38 year old male with altered mental status. EXAM: CT HEAD WITHOUT CONTRAST TECHNIQUE: Contiguous axial images were obtained from the base of the skull through the vertex without intravenous contrast. RADIATION DOSE REDUCTION: This exam was performed according to the departmental dose-optimization program which includes automated exposure control, adjustment of the mA and/or kV according to patient size and/or use of iterative reconstruction technique. COMPARISON:  Brain MRI and Head CT 07/13/2022. FINDINGS: Brain: Cerebral volume is stable, normal. No midline shift, ventriculomegaly, mass effect, evidence of mass lesion, intracranial hemorrhage or evidence of cortically based acute infarction. Gray-white matter differentiation is within normal limits throughout the brain. Vascular: No suspicious intracranial vascular hyperdensity. Skull: Intact, negative. Sinuses/Orbits: Visualized paranasal sinuses and mastoids are well aerated. Other: No acute orbit or scalp soft tissue finding. IMPRESSION: Stable, normal noncontrast CT appearance of the brain. Electronically Signed   By: VEAR Hurst M.D.   On: 02/09/2023 06:10     Discharge Exam: Vitals:   02/17/23 1936 02/18/23 0600  BP: (!) 153/81 113/80  Pulse: 84 68  Resp: 14 20  Temp: 98.3  F (36.8 C) 99.7 F (37.6 C)  SpO2: 96% 98%   Vitals:   02/17/23 1634 02/17/23 1650 02/17/23 1936 02/18/23 0600  BP: 132/73 (!) 146/82 (!) 153/81 113/80  Pulse: 67 82 84 68  Resp:  20 14 20   Temp:  98.2 F (36.8 C) 98.3 F (36.8 C) 99.7 F (37.6 C)  TempSrc:  Oral Oral   SpO2:  97% 96% 98%  Weight:      Height:        General: Pt is alert, awake, not in acute distress Cardiovascular: RRR, S1/S2 +, no rubs, no gallops Respiratory: CTA bilaterally, no wheezing, no rhonchi Abdominal: Soft, NT, ND, bowel sounds + Extremities: no edema, no cyanosis    The results of significant diagnostics from this hospitalization (including imaging, microbiology, ancillary and laboratory) are listed below for reference.     Microbiology: Recent Results (from the past 240 hours)  Resp panel by RT-PCR (RSV, Flu A&B, Covid) Anterior Nasal Swab     Status: None   Collection Time: 02/16/23  1:52 PM   Specimen: Anterior Nasal Swab  Result Value Ref Range Status   SARS Coronavirus 2 by RT PCR NEGATIVE NEGATIVE Final    Comment: (  NOTE) SARS-CoV-2 target nucleic acids are NOT DETECTED.  The SARS-CoV-2 RNA is generally detectable in upper respiratory specimens during the acute phase of infection. The lowest concentration of SARS-CoV-2 viral copies this assay can detect is 138 copies/mL. A negative result does not preclude SARS-Cov-2 infection and should not be used as the sole basis for treatment or other patient management decisions. A negative result may occur with  improper specimen collection/handling, submission of specimen other than nasopharyngeal swab, presence of viral mutation(s) within the areas targeted by this assay, and inadequate number of viral copies(<138 copies/mL). A negative result must be combined with clinical observations, patient history, and epidemiological information. The expected result is Negative.  Fact Sheet for Patients:   bloggercourse.com  Fact Sheet for Healthcare Providers:  seriousbroker.it  This test is no t yet approved or cleared by the [place]  FDA and  has been authorized for detection and/or diagnosis of SARS-CoV-2 by FDA under an Emergency Use Authorization (EUA). This EUA will remain  in effect (meaning this test can be used) for the duration of the COVID-19 declaration under Section 564(b)(1) of the Act, 21 U.S.C.section 360bbb-3(b)(1), unless the authorization is terminated  or revoked sooner.       Influenza A by PCR NEGATIVE NEGATIVE Final   Influenza B by PCR NEGATIVE NEGATIVE Final    Comment: (NOTE) The Xpert Xpress SARS-CoV-2/FLU/RSV plus assay is intended as an aid in the diagnosis of influenza from Nasopharyngeal swab specimens and should not be used as a sole basis for treatment. Nasal washings and aspirates are unacceptable for Xpert Xpress SARS-CoV-2/FLU/RSV testing.  Fact Sheet for Patients: bloggercourse.com  Fact Sheet for Healthcare Providers: seriousbroker.it  This test is not yet approved or cleared by the [place]  FDA and has been authorized for detection and/or diagnosis of SARS-CoV-2 by FDA under an Emergency Use Authorization (EUA). This EUA will remain in effect (meaning this test can be used) for the duration of the COVID-19 declaration under Section 564(b)(1) of the Act, 21 U.S.C. section 360bbb-3(b)(1), unless the authorization is terminated or revoked.     Resp Syncytial Virus by PCR NEGATIVE NEGATIVE Final    Comment: (NOTE) Fact Sheet for Patients: bloggercourse.com  Fact Sheet for Healthcare Providers: seriousbroker.it  This test is not yet approved or cleared by the [place]  FDA and has been authorized for detection and/or diagnosis of SARS-CoV-2 by FDA under an Emergency Use  Authorization (EUA). This EUA will remain in effect (meaning this test can be used) for the duration of the COVID-19 declaration under Section 564(b)(1) of the Act, 21 U.S.C. section 360bbb-3(b)(1), unless the authorization is terminated or revoked.  Performed at [facility], [address], [place], [place] [address]   Blood Culture (routine x 2)     Status: None (Preliminary result)   Collection Time: 02/16/23  2:34 PM   Specimen: BLOOD LEFT HAND  Result Value Ref Range Status   Specimen Description   Final    BLOOD LEFT HAND BOTTLES DRAWN AEROBIC AND ANAEROBIC   Special Requests Blood Culture adequate volume  Final   Culture   Final    NO GROWTH 2 DAYS Performed at [facility], [address], [place], [place] [address]    Report Status PENDING  Incomplete  Blood Culture (routine x 2)     Status: None (Preliminary result)   Collection Time: 02/16/23  2:40 PM   Specimen: Left Antecubital; Blood  Result Value Ref Range Status   Specimen Description  Final    LEFT ANTECUBITAL BOTTLES DRAWN AEROBIC AND ANAEROBIC   Special Requests Blood Culture adequate volume  Final   Culture   Final    NO GROWTH 2 DAYS Performed at [facility], [address], [place], [place] [address]    Report Status PENDING  Incomplete     Labs: BNP (last 3 results) No results for input(s): BNP in the last 8760 hours. Basic Metabolic Panel: Recent Labs  Lab 02/16/23 1434 02/16/23 1648 02/17/23 0356 02/18/23 0346  NA 138  --  140 138  K 3.6  --  3.9 3.5  CL 105  --  108 106  CO2 23  --  23 22  GLUCOSE 145*  --  166* 238*  BUN 15  --  15 14  CREATININE 0.86  --  0.84 0.83  CALCIUM 8.4*  --  9.0 8.6*  MG  --  1.7  --  2.1  PHOS  --  1.2*  --   --    Liver Function Tests: Recent Labs  Lab 02/16/23 1434 02/17/23 0356 02/18/23 0346  AST 79* 53* 36  ALT 81* 75* 61*  ALKPHOS 54 48 41  BILITOT 1.4* 1.3* 0.9  PROT 6.4* 6.9 6.4*  ALBUMIN 3.8 3.9 3.5   No results for input(s): LIPASE,  AMYLASE in the last 168 hours. No results for input(s): AMMONIA  in the last 168 hours. CBC: Recent Labs  Lab 02/16/23 1434 02/17/23 0356 02/18/23 0346  WBC 10.6* 18.5* 15.6*  NEUTROABS 8.7*  --   --   HGB 13.7 13.9 12.8*  HCT 38.8* 39.6 37.0*  MCV 95.8 96.8 98.7  PLT 145* 157 151   Cardiac Enzymes: No results for input(s): CKTOTAL, CKMB, CKMBINDEX, TROPONINI in the last 168 hours. BNP: Invalid input(s): POCBNP CBG: Recent Labs  Lab 02/16/23 2106  GLUCAP 253*   D-Dimer No results for input(s): DDIMER in the last 72 hours. Hgb A1c No results for input(s): HGBA1C in the last 72 hours. Lipid Profile No results for input(s): CHOL, HDL, LDLCALC, TRIG, CHOLHDL, LDLDIRECT in the last 72 hours. Thyroid  function studies No results for input(s): TSH, T4TOTAL, T3FREE, THYROIDAB in the last 72 hours.  Invalid input(s): FREET3 Anemia work up No results for input(s): VITAMINB12, FOLATE, FERRITIN, TIBC, IRON, RETICCTPCT in the last 72 hours. Urinalysis    Component Value Date/Time   COLORURINE YELLOW 02/16/2023 1444   APPEARANCEUR CLEAR 02/16/2023 1444   LABSPEC 1.017 02/16/2023 1444   PHURINE 6.0 02/16/2023 1444   GLUCOSEU NEGATIVE 02/16/2023 1444   HGBUR NEGATIVE 02/16/2023 1444   BILIRUBINUR NEGATIVE 02/16/2023 1444   KETONESUR 5 (A) 02/16/2023 1444   PROTEINUR NEGATIVE 02/16/2023 1444   UROBILINOGEN 1.0 01/09/2007 0333   NITRITE NEGATIVE 02/16/2023 1444   LEUKOCYTESUR NEGATIVE 02/16/2023 1444   Sepsis Labs Recent Labs  Lab 02/16/23 1434 02/17/23 0356 02/18/23 0346  WBC 10.6* 18.5* 15.6*   Microbiology Recent Results (from the past 240 hours)  Resp panel by RT-PCR (RSV, Flu A&B, Covid) Anterior Nasal Swab     Status: None   Collection Time: 02/16/23  1:52 PM   Specimen: Anterior Nasal Swab  Result Value Ref Range Status   SARS Coronavirus 2 by RT PCR NEGATIVE NEGATIVE Final    Comment: (NOTE) SARS-CoV-2 target  nucleic acids are NOT DETECTED.  The SARS-CoV-2 RNA is generally detectable in upper respiratory specimens during the acute phase of infection. The lowest concentration of SARS-CoV-2 viral copies this assay can detect is 138 copies/mL.  A negative result does not preclude SARS-Cov-2 infection and should not be used as the sole basis for treatment or other patient management decisions. A negative result may occur with  improper specimen collection/handling, submission of specimen other than nasopharyngeal swab, presence of viral mutation(s) within the areas targeted by this assay, and inadequate number of viral copies(<138 copies/mL). A negative result must be combined with clinical observations, patient history, and epidemiological information. The expected result is Negative.  Fact Sheet for Patients:  bloggercourse.com  Fact Sheet for Healthcare Providers:  seriousbroker.it  This test is no t yet approved or cleared by the [place]  FDA and  has been authorized for detection and/or diagnosis of SARS-CoV-2 by FDA under an Emergency Use Authorization (EUA). This EUA will remain  in effect (meaning this test can be used) for the duration of the COVID-19 declaration under Section 564(b)(1) of the Act, 21 U.S.C.section 360bbb-3(b)(1), unless the authorization is terminated  or revoked sooner.       Influenza A by PCR NEGATIVE NEGATIVE Final   Influenza B by PCR NEGATIVE NEGATIVE Final    Comment: (NOTE) The Xpert Xpress SARS-CoV-2/FLU/RSV plus assay is intended as an aid in the diagnosis of influenza from Nasopharyngeal swab specimens and should not be used as a sole basis for treatment. Nasal washings and aspirates are unacceptable for Xpert Xpress SARS-CoV-2/FLU/RSV testing.  Fact Sheet for Patients: bloggercourse.com  Fact Sheet for Healthcare  Providers: seriousbroker.it  This test is not yet approved or cleared by the [place]  FDA and has been authorized for detection and/or diagnosis of SARS-CoV-2 by FDA under an Emergency Use Authorization (EUA). This EUA will remain in effect (meaning this test can be used) for the duration of the COVID-19 declaration under Section 564(b)(1) of the Act, 21 U.S.C. section 360bbb-3(b)(1), unless the authorization is terminated or revoked.     Resp Syncytial Virus by PCR NEGATIVE NEGATIVE Final    Comment: (NOTE) Fact Sheet for Patients: bloggercourse.com  Fact Sheet for Healthcare Providers: seriousbroker.it  This test is not yet approved or cleared by the [place]  FDA and has been authorized for detection and/or diagnosis of SARS-CoV-2 by FDA under an Emergency Use Authorization (EUA). This EUA will remain in effect (meaning this test can be used) for the duration of the COVID-19 declaration under Section 564(b)(1) of the Act, 21 U.S.C. section 360bbb-3(b)(1), unless the authorization is terminated or revoked.  Performed at [facility], [address], [place], [place] [address]   Blood Culture (routine x 2)     Status: None (Preliminary result)   Collection Time: 02/16/23  2:34 PM   Specimen: BLOOD LEFT HAND  Result Value Ref Range Status   Specimen Description   Final    BLOOD LEFT HAND BOTTLES DRAWN AEROBIC AND ANAEROBIC   Special Requests Blood Culture adequate volume  Final   Culture   Final    NO GROWTH 2 DAYS Performed at [facility], [address], [place], [place] [address]    Report Status PENDING  Incomplete  Blood Culture (routine x 2)     Status: None (Preliminary result)   Collection Time: 02/16/23  2:40 PM   Specimen: Left Antecubital; Blood  Result Value Ref Range Status   Specimen Description   Final    LEFT ANTECUBITAL BOTTLES DRAWN AEROBIC AND ANAEROBIC   Special  Requests Blood Culture adequate volume  Final   Culture   Final    NO GROWTH 2 DAYS Performed at [facility],  [address], [place], [place] [address]    Report Status PENDING  Incomplete     Time coordinating discharge: 35 minutes  SIGNED:   Adron JONETTA Fairly, DO [facility] 02/18/2023, 9:56 AM  If 7PM-7AM, please contact night-coverage [email or web address]

## 2023-02-18 NOTE — Progress Notes (Signed)
 Nsg Discharge Note  Admit Date:  02/16/2023 Discharge date: 02/18/2023   Larry Rojas to be D/C'd Home per MD order.  AVS completed.   Patient/caregiver able to verbalize understanding.  Discharge Medication: Allergies as of 02/18/2023       Reactions   Fluoxetine Other (See Comments)   Suicidal ideation   Trazodone And Nefazodone Other (See Comments)   Hallucinations   Hydrocodone          Medication List     TAKE these medications    acetaminophen  500 MG tablet Commonly known as: TYLENOL  Take 500 mg by mouth every 6 (six) hours as needed for moderate pain (pain score 4-6).   ALPRAZolam  0.5 MG tablet Commonly known as: XANAX  Take 1 mg by mouth 2 (two) times daily.   amLODipine  10 MG tablet Commonly known as: NORVASC  Take 10 mg by mouth daily.   amoxicillin -clavulanate 875-125 MG tablet Commonly known as: AUGMENTIN  Take 1 tablet by mouth 2 (two) times daily for 5 days.   ezetimibe  10 MG tablet Commonly known as: ZETIA  Take 10 mg by mouth daily.   gabapentin  300 MG capsule Commonly known as: NEURONTIN  Take 300 mg by mouth 2 (two) times daily.   levETIRAcetam  500 MG tablet Commonly known as: Keppra  Take 1 tablet (500 mg total) by mouth 2 (two) times daily. What changed: when to take this   levothyroxine  25 MCG tablet Commonly known as: SYNTHROID  Take 25 mcg by mouth daily.   meloxicam  15 MG tablet Commonly known as: MOBIC  Take 15 mg by mouth daily.   niacin  500 MG ER tablet Commonly known as: VITAMIN B3 Take 500 mg by mouth at bedtime.   omeprazole 20 MG capsule Commonly known as: PRILOSEC Take 20 mg by mouth daily as needed (Acid reflux).   oxyCODONE -acetaminophen  7.5-325 MG tablet Commonly known as: PERCOCET Take 1 tablet by mouth every 8 (eight) hours as needed for severe pain (pain score 7-10).   tiZANidine  4 MG tablet Commonly known as: ZANAFLEX  Take 4 mg by mouth every 6 (six) hours as needed for muscle spasms.        Discharge  Assessment: Vitals:   02/17/23 1936 02/18/23 0600  BP: (!) 153/81 113/80  Pulse: 84 68  Resp: 14 20  Temp: 98.3 F (36.8 C) 99.7 F (37.6 C)  SpO2: 96% 98%   Skin clean, dry and intact without evidence of skin break down, no evidence of skin tears noted. IV catheter discontinued intact. Site without signs and symptoms of complications - no redness or edema noted at insertion site, patient denies c/o pain - only slight tenderness at site.  Dressing with slight pressure applied.  D/c Instructions-Education: Discharge instructions given to patient/family with verbalized understanding. D/c education completed with patient/family including follow up instructions, medication list, d/c activities limitations if indicated, with other d/c instructions as indicated by MD - patient able to verbalize understanding, all questions fully answered. Patient instructed to return to ED, call 911, or call MD for any changes in condition.  Patient escorted via WC, and D/C home via private auto.  Dagoberto LITTIE Edison, RN 02/18/2023 10:11 AM

## 2023-02-18 NOTE — Progress Notes (Signed)
 Patient rested well. No new complaints this shift.  Patient ambulated around floor without oxygen with no signs of distress.  Patient oxygen saturation in the 90s on room air this am.

## 2023-02-20 LAB — LEGIONELLA PNEUMOPHILA SEROGP 1 UR AG: L. pneumophila Serogp 1 Ur Ag: NEGATIVE

## 2023-02-21 LAB — CULTURE, BLOOD (ROUTINE X 2)
Special Requests: ADEQUATE
Special Requests: ADEQUATE

## 2023-06-12 ENCOUNTER — Ambulatory Visit (HOSPITAL_BASED_OUTPATIENT_CLINIC_OR_DEPARTMENT_OTHER): Admitting: Family

## 2023-06-12 ENCOUNTER — Encounter (HOSPITAL_COMMUNITY): Payer: Self-pay | Admitting: Family

## 2023-06-12 ENCOUNTER — Other Ambulatory Visit: Payer: Self-pay

## 2023-06-12 VITALS — BP 150/88 | HR 76 | Ht 74.0 in | Wt 231.0 lb

## 2023-06-12 DIAGNOSIS — F411 Generalized anxiety disorder: Secondary | ICD-10-CM

## 2023-06-12 DIAGNOSIS — F331 Major depressive disorder, recurrent, moderate: Secondary | ICD-10-CM | POA: Diagnosis not present
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
## 2023-07-16 ENCOUNTER — Ambulatory Visit (HOSPITAL_COMMUNITY): Admitting: Family

## 2023-08-21 ENCOUNTER — Encounter (HOSPITAL_COMMUNITY): Payer: Self-pay | Admitting: Emergency Medicine

## 2023-08-21 ENCOUNTER — Emergency Department (HOSPITAL_COMMUNITY)

## 2023-08-21 ENCOUNTER — Other Ambulatory Visit: Payer: Self-pay

## 2023-08-21 ENCOUNTER — Emergency Department (HOSPITAL_COMMUNITY)
Admission: EM | Admit: 2023-08-21 | Discharge: 2023-08-22 | Disposition: A | Discharge status: Home / Self Care | Attending: Emergency Medicine | Admitting: Emergency Medicine

## 2023-08-21 DIAGNOSIS — E878 Other disorders of electrolyte and fluid balance, not elsewhere classified: Secondary | ICD-10-CM | POA: Insufficient documentation

## 2023-08-21 DIAGNOSIS — D72829 Elevated white blood cell count, unspecified: Secondary | ICD-10-CM | POA: Diagnosis not present

## 2023-08-21 DIAGNOSIS — R079 Chest pain, unspecified: Secondary | ICD-10-CM | POA: Insufficient documentation

## 2023-08-21 DIAGNOSIS — E876 Hypokalemia: Secondary | ICD-10-CM | POA: Insufficient documentation

## 2023-08-21 DIAGNOSIS — M5412 Radiculopathy, cervical region: Secondary | ICD-10-CM | POA: Insufficient documentation

## 2023-08-21 DIAGNOSIS — M546 Pain in thoracic spine: Secondary | ICD-10-CM | POA: Diagnosis not present

## 2023-08-21 DIAGNOSIS — R200 Anesthesia of skin: Secondary | ICD-10-CM | POA: Diagnosis present

## 2023-08-21 LAB — MAGNESIUM: Magnesium: 2.4 mg/dL (ref 1.7–2.4)

## 2023-08-21 LAB — CBC
HCT: 44.6 % (ref 39.0–52.0)
Hemoglobin: 15.7 g/dL (ref 13.0–17.0)
MCH: 34.1 pg — ABNORMAL HIGH (ref 26.0–34.0)
MCHC: 35.2 g/dL (ref 30.0–36.0)
MCV: 96.7 fL (ref 80.0–100.0)
Platelets: 216 K/uL (ref 150–400)
RBC: 4.61 MIL/uL (ref 4.22–5.81)
RDW: 11.7 % (ref 11.5–15.5)
WBC: 10.7 K/uL — ABNORMAL HIGH (ref 4.0–10.5)
nRBC: 0 % (ref 0.0–0.2)

## 2023-08-21 LAB — COMPREHENSIVE METABOLIC PANEL WITH GFR
ALT: 42 U/L (ref 0–44)
AST: 34 U/L (ref 15–41)
Albumin: 4.4 g/dL (ref 3.5–5.0)
Alkaline Phosphatase: 70 U/L (ref 38–126)
Anion gap: 16 — ABNORMAL HIGH (ref 5–15)
BUN: 9 mg/dL (ref 6–20)
CO2: 22 mmol/L (ref 22–32)
Calcium: 9.1 mg/dL (ref 8.9–10.3)
Chloride: 104 mmol/L (ref 98–111)
Creatinine, Ser: 0.81 mg/dL (ref 0.61–1.24)
GFR, Estimated: >60 mL/min (ref >60)
Glucose, Bld: 92 mg/dL (ref 70–99)
Potassium: 3.3 mmol/L — ABNORMAL LOW (ref 3.5–5.1)
Sodium: 142 mmol/L (ref 135–145)
Total Bilirubin: 0.7 mg/dL (ref 0.0–1.2)
Total Protein: 7.1 g/dL (ref 6.5–8.1)

## 2023-08-21 LAB — TROPONIN I (HIGH SENSITIVITY): Troponin I (High Sensitivity): 3 ng/L (ref <18)

## 2023-08-21 NOTE — ED Triage Notes (Signed)
 Patient states today while at worked he strained his back at 10:30 am and since then his hands and feet have been numb bilaterally and reports chest pressure radiating down to left arm. Patient endorses dizziness, nausea and emesis.

## 2023-08-21 NOTE — ED Notes (Signed)
 EKG attempted, unsuccessful. Will attempt again when pt returns from x-ray.

## 2023-08-21 NOTE — ED Provider Triage Note (Signed)
 Emergency Medicine Provider Triage Evaluation Note  Larry Rojas , a 38 y.o. male  was evaluated in triage.  Pt complains of numbness. States he was at work today at 10:30 am and kicked a dolly and his neck snapped back and since then he has had diffuse numbness and weakness throughout his whole body. Endorses neck pain and headache. Reports hx of previous neck surgery. He is able to walk. His wife drove him here. Denies loss of bowel or bladder function or saddle anesthesia.  Also endorses chest pain and shortness of breath which started at the same time.  Review of Systems  Positive:  Negative:   Physical Exam  BP 114/71 (BP Location: Right Arm)   Pulse (!) 101   Temp 98.6 F (37 C)   Resp 14   Ht 6' 2 (1.88 m)   Wt 104 kg   SpO2 97%   BMI 29.44 kg/m  Gen:   Awake, no distress   Resp:  Normal effort  MSK:   Moves extremities without difficulty  Other:  Good radial, DP and PT pulses throughout. Weakness present bilaterally. Able to walk and lift his arms and hold them up. Midline cervical spine TTP. Alert and oriented.   Medical Decision Making  Medically screening exam initiated at 10:08 PM.  Appropriate orders placed.  Larry Rojas was informed that the remainder of the evaluation will be completed by another provider, this initial triage assessment does not replace that evaluation, and the importance of remaining in the ED until their evaluation is complete.  Work-up initiated. Given concern for neck injury, patient placed in c collar.   Nora Lauraine LABOR, DEVONNA 08/21/23 2213

## 2023-08-22 ENCOUNTER — Emergency Department (HOSPITAL_COMMUNITY)

## 2023-08-22 LAB — TROPONIN I (HIGH SENSITIVITY): Troponin I (High Sensitivity): 4 ng/L (ref <18)

## 2023-08-22 MED ORDER — PREDNISONE 10 MG PO TABS
30.0000 mg | ORAL_TABLET | Freq: Every day | ORAL | 0 refills | Status: AC
Start: 2023-08-22 — End: 2023-08-27

## 2023-08-22 MED ORDER — POTASSIUM CHLORIDE CRYS ER 20 MEQ PO TBCR
40.0000 meq | EXTENDED_RELEASE_TABLET | Freq: Once | ORAL | Status: AC
Start: 2023-08-22 — End: 2023-08-22
  Administered 2023-08-22: 40 meq via ORAL
  Filled 2023-08-22: qty 2

## 2023-08-22 NOTE — ED Notes (Signed)
 C-collar removed with consent of PA.

## 2023-08-22 NOTE — ED Provider Notes (Signed)
 [place] EMERGENCY DEPARTMENT AT [facility] Provider Note   CSN: [id number] Arrival date & time: 08/21/23  2135     Patient presents with: Back Pain, Numbness, and Chest Pain   Larry Rojas is a 38 y.o. male.  Patient presents to the emergency department today with concerns of back pain and weakness and numbness to bilateral upper and lower extremities.  He reports that while he was at work, he had strained his upper back prior to the onset of the symptoms.  Endorsing some chest pressure with radiation down the left arm.  Has felt dizzy, nauseous and has had some vomiting today.  No prior history of similar symptoms.  Not on any blood thinners.  Denies any head injury or head trauma.    Back Pain Associated symptoms: chest pain   Chest Pain Associated symptoms: back pain        Prior to Admission medications   Medication Sig Start Date End Date Taking? Authorizing Provider  predniSONE  (DELTASONE ) 10 MG tablet Take 3 tablets (30 mg total) by mouth daily for 5 days. 08/22/23 08/27/23 Yes Melanee Cordial A, PA-C  acetaminophen  (TYLENOL ) 500 MG tablet Take 500 mg by mouth every 6 (six) hours as needed for moderate pain (pain score 4-6).    [provider]  ALPRAZolam  (XANAX ) 0.5 MG tablet Take 1 mg by mouth 2 (two) times daily. Patient not taking: Reported on 06/12/2023 01/25/23   [provider]  amLODipine  (NORVASC ) 10 MG tablet Take 10 mg by mouth daily.    [provider]  clonazePAM (KLONOPIN) 0.5 MG tablet Take 1 mg by mouth 2 (two) times daily. 06/11/23   [provider]  ezetimibe  (ZETIA ) 10 MG tablet Take 10 mg by mouth daily. Patient not taking: Reported on 06/12/2023 02/01/23   [provider]  gabapentin  (NEURONTIN ) 300 MG capsule Take 300 mg by mouth 2 (two) times daily.    [provider]  levETIRAcetam  (KEPPRA ) 500 MG tablet Take 1 tablet (500 mg total) by mouth 2 (two) times daily. Patient not taking: Reported  on 06/12/2023 07/14/22   Ilah Corean HERO, PA-C  levothyroxine  (SYNTHROID ) 25 MCG tablet Take 25 mcg by mouth daily. 02/11/21   [provider]  meloxicam  (MOBIC ) 15 MG tablet Take 15 mg by mouth daily. 02/05/23   [provider]  niacin  (VITAMIN B3) 500 MG ER tablet Take 500 mg by mouth at bedtime. Patient not taking: Reported on 06/12/2023    [provider]  omeprazole (PRILOSEC) 20 MG capsule Take 20 mg by mouth daily as needed (Acid reflux).    [provider]  oxyCODONE  (OXY IR/ROXICODONE ) 5 MG immediate release tablet Take 5 mg by mouth every 6 (six) hours as needed.    [provider]  sertraline (ZOLOFT) 50 MG tablet Take 50 mg by mouth daily. 06/11/23   [provider]  tiZANidine  (ZANAFLEX ) 4 MG tablet Take 4 mg by mouth every 6 (six) hours as needed for muscle spasms. 07/03/22   [provider]    Allergies: Fluoxetine, Trazodone and nefazodone, and Hydrocodone     Review of Systems  Cardiovascular:  Positive for chest pain.  Musculoskeletal:  Positive for back pain.  All other systems reviewed and are negative.   Updated Vital Signs BP 114/75 (BP Location: Right Arm)   Pulse 95   Temp (!) 97.5 F (36.4 C) (Temporal)   Resp 18   Ht 6' 2 (1.88 m)   Wt 104  kg   SpO2 99%   BMI 29.44 kg/m   Physical Exam Vitals and nursing note reviewed.  Constitutional:      General: He is not in acute distress.    Appearance: He is well-developed.  HENT:     Head: Normocephalic and atraumatic.  Eyes:     Conjunctiva/sclera: Conjunctivae normal.  Cardiovascular:     Rate and Rhythm: Normal rate and regular rhythm.     Heart sounds: No murmur heard. Pulmonary:     Effort: Pulmonary effort is normal. No respiratory distress.     Breath sounds: Normal breath sounds. No decreased breath sounds, wheezing, rhonchi or rales.  Abdominal:     Palpations: Abdomen is soft.     Tenderness: There is no abdominal tenderness.   Musculoskeletal:        General: No swelling.     Cervical back: Neck supple.  Skin:    General: Skin is warm and dry.     Capillary Refill: Capillary refill takes less than 2 seconds.  Neurological:     Mental Status: He is alert and oriented to person, place, and time.     Cranial Nerves: No cranial nerve deficit.     Deep Tendon Reflexes: Reflexes are normal and symmetric.     Reflex Scores:      Tricep reflexes are 2+ on the right side and 2+ on the left side.      Patellar reflexes are 2+ on the right side and 2+ on the left side.      Achilles reflexes are 2+ on the right side and 2+ on the left side.    Comments: Diffuse weakness all over with 4/5 strength in bilateral upper and lower extremities.   Psychiatric:        Mood and Affect: Mood normal.     (all labs ordered are listed, but only abnormal results are displayed) Labs Reviewed  CBC - Abnormal; Notable for the following components:      Result Value   WBC 10.7 (*)    MCH 34.1 (*)    All other components within normal limits  COMPREHENSIVE METABOLIC PANEL WITH GFR - Abnormal; Notable for the following components:   Potassium 3.3 (*)    Anion gap 16 (*)    All other components within normal limits  MAGNESIUM  TROPONIN I (HIGH SENSITIVITY)  TROPONIN I (HIGH SENSITIVITY)    EKG: None  Radiology: MR Cervical Spine Wo Contrast Result Date: 08/22/2023 CLINICAL DATA:  Initial evaluation for acute neck pain. EXAM: MRI CERVICAL SPINE WITHOUT CONTRAST TECHNIQUE: Multiplanar, multisequence MR imaging of the cervical spine was performed. No intravenous contrast was administered. COMPARISON:  Prior studies from 04/05/2021 and 10/02/2017. FINDINGS: Alignment: Straightening of the normal cervical lordosis. No listhesis. Vertebrae: Prior ACDF at C4-5. Vertebral body height maintained without acute or chronic fracture. Bone marrow signal intensity overall within normal limits. No worrisome osseous lesions or abnormal marrow  edema. Cord: Normal signal and morphology. Posterior Fossa, vertebral arteries, paraspinal tissues: Unremarkable. Disc levels: C2-C3: Normal interspace. Mild left-sided facet spurring. No stenosis. C3-C4: Mild left-sided uncovertebral spurring without significant disc bulge. No spinal stenosis. Mild left C4 foraminal narrowing. Right neural foramen is patent. C4-C5: Prior fusion. No residual spinal stenosis. Left-sided uncovertebral spurring with mild to moderate left C5 foraminal stenosis. Right neural foramen remains patent. C5-C6: Mild disc bulge with uncovertebral spurring. No spinal stenosis. Mild left C6 foraminal narrowing. Right neural foramen remains patent. C6-C7:  Unremarkable. C7-T1: Normal interspace.  Minimal facet hypertrophy. No canal or foraminal stenosis. IMPRESSION: 1. No acute abnormality within the cervical spine. 2. Prior ACDF at C4-5 without residual spinal stenosis. 3. Mild left C4, mild to moderate left C5, and mild left C6 foraminal stenosis related to uncovertebral disease. Electronically Signed   By: Morene Hoard M.D.   On: 08/22/2023 02:30   MR BRAIN WO CONTRAST Result Date: 08/22/2023 CLINICAL DATA:  Initial evaluation for acute neuro deficit, stroke suspected. EXAM: MRI HEAD WITHOUT CONTRAST TECHNIQUE: Multiplanar, multiecho pulse sequences of the brain and surrounding structures were obtained without intravenous contrast. COMPARISON:  Prior studies from 02/09/2023 and 07/13/2022. FINDINGS: Brain: Cerebral volume within normal limits for age. No focal parenchymal signal abnormality. No abnormal foci of restricted diffusion to suggest acute or subacute ischemia. Gray-white matter differentiation well maintained. No encephalomalacia to suggest chronic cortical infarction or other insult. No foci of susceptibility artifact indicative of acute or chronic intracranial blood products. No mass lesion, midline shift or mass effect. Ventricles normal in size and morphology without  hydrocephalus. No extra-axial fluid collection. Pituitary gland and suprasellar region within normal limits. Vascular: Major intracranial vascular flow voids are well maintained. Skull and upper cervical spine: Craniocervical junction within normal limits. Bone marrow signal intensity within normal limits. No scalp soft tissue abnormality. Sinuses/Orbits: Globes and orbital soft tissues are within normal limits. Mild scattered mucosal thickening present about the sphenoethmoidal and maxillary sinuses. No significant mastoid effusion. Other: None. IMPRESSION: Normal brain MRI. No acute intracranial abnormality identified. Electronically Signed   By: Morene Hoard M.D.   On: 08/22/2023 02:24   DG Chest 2 View Result Date: 08/21/2023 CLINICAL DATA:  Chest pain and back pain. EXAM: CHEST - 2 VIEW COMPARISON:  Portable chest 02/16/2023 FINDINGS: The heart size and mediastinal contours are within normal limits. Both lungs are clear. The visualized skeletal structures are intact, with slight levoscoliosis of the thoracic spine and scattered endplate Schmorl's nodes. IMPRESSION: No evidence of acute chest disease. Slight levoscoliosis and mild degenerative change of the thoracic spine. Electronically Signed   By: Francis Quam M.D.   On: 08/21/2023 22:44     Procedures   Medications Ordered in the ED  potassium chloride  SA (KLOR-CON  M) CR tablet 40 mEq (40 mEq Oral Given 08/22/23 0240)                                    Medical Decision Making Amount and/or Complexity of Data Reviewed Labs: ordered.  Risk Prescription drug management.   This patient presents to the ED for concern of back pain, weakness.  Differential diagnosis includes stroke, myelopathy, nerve impingement, cervical radiculopathy   Lab Tests:  I Ordered, and personally interpreted labs.  The pertinent results include: CBC reveals mild leukocytosis, CMP with mild hypokalemia at 3.3, anion gap slightly elevated at 16,  magnesium unremarkable 2.4, troponin negative x 2   Imaging Studies ordered:  I ordered imaging studies including chest x-ray, MRI brain, MR cervical spine I independently visualized and interpreted imaging which showed no acute cardiopulmonary process, normal brain MRI, no acute abnormality in the cervical spine but mild left C4, mild to moderate left C5, and mild left C6 foraminal stenosis related to uncovertebral disease.  I agree with the radiologist interpretation   Medicines ordered and prescription drug management:  I ordered medication including potassium for hypokalemia Reevaluation of the patient after these medicines showed that the patient improved  I have reviewed the patients home medicines and have made adjustments as needed   Problem List / ED Course:  Patient presents to the emergency department concerns of back strain, numbness and tingling.  He reports that while he was at work, he strained his upper back that has now progressed to causing numbness, tingling, weakness in bilateral upper and lower extremities.  Denies any feelings of severe headache, vision changes, slurred speech, or cognitive changes.  No history of similar symptoms.  Denies any recent injury or trauma. On exam, patient has 4-5 strength in the upper and lower extremities but symmetric bilaterally.  No acute neurological deficits seen with no appreciable slurred speech, facial droop, or dysarthria.  Patient cognitively at baseline and interacting appropriately.  Doubtful of stroke. Patient's lab workup shows with hypokalemia at 3.3 with elevated anion gap which may be contributing some of his symptoms secondary to dehydration, magnesium unremarkable 2.4, CBC shows leukocytosis at 10.7 only minimally elevated, troponin negative x 2. Chest x-ray, MRI brain, MR cervical spine reassuring with no acute abnormality seen.  C4, C5, C6 with varying degrees of foraminal stenosis.  Assuming that this may likely be  causing some of his current symptoms, otherwise reassuring workup with no acute indication for admission.  Will discharge home a short course of prednisone  burst for the next 5 days.  Advise close follow-up with primary care provider if symptoms not improving patient would likely benefit from outpatient neurosurgery follow-up.  He stable at this time for outpatient follow-up and discharged home.  Final diagnoses:  Cervical radiculopathy    ED Discharge Orders          Ordered    predniSONE  (DELTASONE ) 10 MG tablet  Daily        08/22/23 0312               Avila Albritton A, PA-C 08/22/23 0645    Long, Joshua G, MD 08/23/23 [phone]

## 2023-08-22 NOTE — Discharge Instructions (Addendum)
 You were seen in the emergency department today for concerns of pain in your back with some weakness in your arms and legs.  Your labs and imaging were thankfully reassuring today.  There does appear to be some findings of a possible area of nerve impingement that would explain some of the tingling and weakness that you have noticed more so on your left arm.  There is no evidence of stroke.  I am starting on a short course of prednisone  to help manage your symptoms.  For any concerns of new or worsening symptoms, return to the emergency department.

## 2023-08-22 NOTE — ED Notes (Signed)
 Patient in MRI at this time.

## 2023-08-22 NOTE — ED Notes (Addendum)
 Patient given pedialyte to drink. Made aware that this is necessary.  Patient states that he stills feels numbness to bilateral arms, legs, and back.

## 2024-03-18 ENCOUNTER — Other Ambulatory Visit: Payer: Self-pay | Admitting: Physician Assistant

## 2024-03-18 ENCOUNTER — Encounter: Payer: Self-pay | Admitting: Physician Assistant

## 2024-03-18 DIAGNOSIS — G5621 Lesion of ulnar nerve, right upper limb: Secondary | ICD-10-CM

## 2024-03-24 ENCOUNTER — Other Ambulatory Visit
